# Patient Record
Sex: Male | Born: 1960 | Race: White | Hispanic: No | Marital: Married | Smoking: Never smoker
Health system: Southern US, Community
[De-identification: ages and names within clinical notes are randomized; demographics above are authoritative.]

## PROBLEM LIST (undated history)

## (undated) DIAGNOSIS — E039 Hypothyroidism, unspecified: Secondary | ICD-10-CM

## (undated) DIAGNOSIS — E119 Type 2 diabetes mellitus without complications: Secondary | ICD-10-CM

## (undated) DIAGNOSIS — I10 Essential (primary) hypertension: Secondary | ICD-10-CM

## (undated) DIAGNOSIS — E1140 Type 2 diabetes mellitus with diabetic neuropathy, unspecified: Secondary | ICD-10-CM

## (undated) DIAGNOSIS — E7800 Pure hypercholesterolemia, unspecified: Secondary | ICD-10-CM

## (undated) HISTORY — DX: Type 2 diabetes mellitus without complications: E11.9

## (undated) HISTORY — PX: VASECTOMY: SHX75

## (undated) HISTORY — DX: Hypothyroidism, unspecified: E03.9

## (undated) HISTORY — DX: Pure hypercholesterolemia, unspecified: E78.00

## (undated) HISTORY — DX: Essential (primary) hypertension: I10

## (undated) HISTORY — PX: ABSCESS DRAINAGE: SHX1119

---

## 1998-02-09 HISTORY — PX: TONSILLECTOMY: SUR1361

## 2005-02-09 HISTORY — PX: HERNIA REPAIR: SHX51

## 2010-10-02 ENCOUNTER — Emergency Department (HOSPITAL_COMMUNITY)
Admission: EM | Admit: 2010-10-02 | Discharge: 2010-10-03 | Disposition: A | Discharge status: Home / Self Care | Payer: BC Managed Care – PPO | Attending: Emergency Medicine | Admitting: Emergency Medicine

## 2010-10-02 ENCOUNTER — Emergency Department (HOSPITAL_COMMUNITY): Payer: BC Managed Care – PPO

## 2010-10-02 ENCOUNTER — Encounter: Payer: Self-pay | Admitting: Emergency Medicine

## 2010-10-02 DIAGNOSIS — M79673 Pain in unspecified foot: Secondary | ICD-10-CM

## 2010-10-02 DIAGNOSIS — M79609 Pain in unspecified limb: Secondary | ICD-10-CM | POA: Insufficient documentation

## 2010-10-02 DIAGNOSIS — E119 Type 2 diabetes mellitus without complications: Secondary | ICD-10-CM | POA: Insufficient documentation

## 2010-10-02 DIAGNOSIS — M7989 Other specified soft tissue disorders: Secondary | ICD-10-CM | POA: Insufficient documentation

## 2010-10-02 HISTORY — DX: Type 2 diabetes mellitus with diabetic neuropathy, unspecified: E11.40

## 2010-10-02 MED ORDER — IBUPROFEN 800 MG PO TABS
800.0000 mg | ORAL_TABLET | Freq: Once | ORAL | Status: AC
  Administered 2010-10-02: 800 mg via ORAL
  Filled 2010-10-02: qty 1

## 2010-10-02 NOTE — ED Provider Notes (Signed)
History     CSN: [id number] Arrival date & time: 10/02/2010 11:11 PM  Chief Complaint  Patient presents with  . Leg Swelling   HPI Comments: Seen 2318  Patient is a 50 y.o. male presenting with lower extremity pain. The history is provided by the patient.  Foot Pain This is a new problem. The current episode started 2 days ago. The problem occurs constantly. The problem has not changed since onset.Pertinent negatives include no chest pain, no abdominal pain, no headaches and no shortness of breath. The symptoms are aggravated by walking and twisting. The symptoms are relieved by nothing. He has tried nothing for the symptoms.    Past Medical History  Diagnosis Date  . Diabetes mellitus   . Neuropathy in diabetes     Past Surgical History  Procedure Date  . Tonsillectomy   . Abscess drainage   . Hernia repair     No family history on file.  History  Substance Use Topics  . Smoking status: Never Smoker   . Smokeless tobacco: Not on file  . Alcohol Use: No      Review of Systems  Respiratory: Negative for shortness of breath.   Cardiovascular: Negative for chest pain.  Gastrointestinal: Negative for abdominal pain.  Neurological: Negative for headaches.  All other systems reviewed and are negative.    Physical Exam  BP 149/93  Pulse 89  Temp(Src) 98.2 F (36.8 C) (Oral)  Resp 16  Ht 5' 9.5" (1.765 m)  Wt 217 lb 3.2 oz (98.521 kg)  BMI 31.61 kg/m2  SpO2 99%  Physical Exam  Nursing note and vitals reviewed. Constitutional: He is oriented to person, place, and time. He appears well-developed and well-nourished.  HENT:  Head: Normocephalic and atraumatic.  Eyes: EOM are normal. Pupils are equal, round, and reactive to light.  Neck: Normal range of motion.  Cardiovascular: Normal rate, normal heart sounds and intact distal pulses.   Pulmonary/Chest: Effort normal and breath sounds normal.  Abdominal: Soft. Bowel sounds are normal.  Musculoskeletal:   Right foot with mild soft tissue swelling and lateral nakle swelling. Pulses DP and PT 2+. FROM. No deformity.  Neurological: He is alert and oriented to person, place, and time.  Skin: Skin is warm and dry.    ED Course  Procedures   Dg Foot Complete Right  10/03/2010  *RADIOLOGY REPORT*  Clinical Data: Right foot pain, swelling and erythema.  RIGHT FOOT COMPLETE - 3+ VIEW  Comparison: None.  Findings: There is no evidence of fracture or dislocation.  The joint spaces are preserved.  There is no evidence of talar subluxation; the subtalar joint is unremarkable in appearance.  A posterior calcaneal spur is noted.  No significant soft tissue abnormalities are seen.  IMPRESSION: No evidence of fracture or dislocation.  Original Report Authenticated By: Tonia Ghent, M.D.  MDM Reviewed: nursing note and vitals Interpretation: x-ray  Pt feels improved after observation and/or treatment in ED.Reviewed results with patient and his wife.      Nicoletta Dress. Colon Branch, MD 10/03/10 [phone]

## 2010-10-02 NOTE — ED Notes (Signed)
Patient c/o right leg swelling since Tuesday; states swelling has gotten worse.

## 2010-10-03 MED ORDER — HYDROCODONE-ACETAMINOPHEN 5-325 MG PO TABS
1.0000 | ORAL_TABLET | Freq: Once | ORAL | Status: AC
  Administered 2010-10-03: 1 via ORAL
  Filled 2010-10-03: qty 1

## 2010-10-03 MED ORDER — IBUPROFEN 600 MG PO TABS: 600.0000 mg | ORAL_TABLET | Freq: Four times a day (QID) | ORAL | Status: AC | PRN

## 2010-10-03 MED ORDER — HYDROCODONE-ACETAMINOPHEN 5-325 MG PO TABS: 1.0000 | ORAL_TABLET | ORAL | Status: AC | PRN

## 2013-01-20 ENCOUNTER — Emergency Department (HOSPITAL_COMMUNITY)
Admission: EM | Admit: 2013-01-20 | Discharge: 2013-01-20 | Disposition: A | Discharge status: Home / Self Care | Payer: BC Managed Care – PPO | Attending: Emergency Medicine | Admitting: Emergency Medicine

## 2013-01-20 ENCOUNTER — Encounter (HOSPITAL_COMMUNITY): Payer: Self-pay | Admitting: Emergency Medicine

## 2013-01-20 ENCOUNTER — Emergency Department (HOSPITAL_COMMUNITY): Payer: BC Managed Care – PPO

## 2013-01-20 DIAGNOSIS — Y92410 Unspecified street and highway as the place of occurrence of the external cause: Secondary | ICD-10-CM | POA: Insufficient documentation

## 2013-01-20 DIAGNOSIS — S4980XA Other specified injuries of shoulder and upper arm, unspecified arm, initial encounter: Secondary | ICD-10-CM | POA: Insufficient documentation

## 2013-01-20 DIAGNOSIS — IMO0002 Reserved for concepts with insufficient information to code with codable children: Secondary | ICD-10-CM | POA: Insufficient documentation

## 2013-01-20 DIAGNOSIS — S46909A Unspecified injury of unspecified muscle, fascia and tendon at shoulder and upper arm level, unspecified arm, initial encounter: Secondary | ICD-10-CM | POA: Insufficient documentation

## 2013-01-20 DIAGNOSIS — E1142 Type 2 diabetes mellitus with diabetic polyneuropathy: Secondary | ICD-10-CM | POA: Insufficient documentation

## 2013-01-20 DIAGNOSIS — T148XXA Other injury of unspecified body region, initial encounter: Secondary | ICD-10-CM

## 2013-01-20 DIAGNOSIS — E1149 Type 2 diabetes mellitus with other diabetic neurological complication: Secondary | ICD-10-CM | POA: Insufficient documentation

## 2013-01-20 DIAGNOSIS — Y9389 Activity, other specified: Secondary | ICD-10-CM | POA: Insufficient documentation

## 2013-01-20 DIAGNOSIS — Z79899 Other long term (current) drug therapy: Secondary | ICD-10-CM | POA: Insufficient documentation

## 2013-01-20 MED ORDER — HYDROCODONE-ACETAMINOPHEN 5-325 MG PO TABS: 1.0000 | ORAL_TABLET | ORAL | Status: DC | PRN

## 2013-01-20 MED ORDER — ONDANSETRON HCL 4 MG PO TABS
4.0000 mg | ORAL_TABLET | Freq: Once | ORAL | Status: AC
  Administered 2013-01-20: 4 mg via ORAL
  Filled 2013-01-20: qty 1

## 2013-01-20 MED ORDER — METHOCARBAMOL 500 MG PO TABS
1000.0000 mg | ORAL_TABLET | Freq: Once | ORAL | Status: AC
  Administered 2013-01-20: 1000 mg via ORAL
  Filled 2013-01-20: qty 2

## 2013-01-20 MED ORDER — METHOCARBAMOL 500 MG PO TABS: 500.0000 mg | ORAL_TABLET | Freq: Three times a day (TID) | ORAL | Status: DC

## 2013-01-20 MED ORDER — HYDROCODONE-ACETAMINOPHEN 5-325 MG PO TABS
2.0000 | ORAL_TABLET | Freq: Once | ORAL | Status: AC
  Administered 2013-01-20: 2 via ORAL
  Filled 2013-01-20: qty 2
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
## 2013-01-20 NOTE — ED Provider Notes (Signed)
History/physical exam/procedure(s) were performed by non-physician practitioner and as supervising physician I was immediately available for consultation/collaboration. I have reviewed all notes and am in agreement with care and plan.   Zimal Weisensel S Narcissa Melder, MD 01/20/13 2316 
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
## 2014-02-09 HISTORY — PX: COLECTOMY: SHX59

## 2014-02-09 HISTORY — PX: SHOULDER SURGERY: SHX246

## 2014-11-19 ENCOUNTER — Ambulatory Visit: Payer: Self-pay | Admitting: "Endocrinology

## 2014-11-28 ENCOUNTER — Ambulatory Visit (INDEPENDENT_AMBULATORY_CARE_PROVIDER_SITE_OTHER): Payer: BLUE CROSS/BLUE SHIELD | Admitting: "Endocrinology

## 2014-11-28 ENCOUNTER — Encounter: Payer: Self-pay | Admitting: "Endocrinology

## 2014-11-28 VITALS — BP 134/82 | HR 116 | Ht 69.0 in | Wt 205.0 lb

## 2014-11-28 DIAGNOSIS — E1165 Type 2 diabetes mellitus with hyperglycemia: Secondary | ICD-10-CM | POA: Insufficient documentation

## 2014-11-28 DIAGNOSIS — I10 Essential (primary) hypertension: Secondary | ICD-10-CM | POA: Diagnosis not present

## 2014-11-28 DIAGNOSIS — E118 Type 2 diabetes mellitus with unspecified complications: Secondary | ICD-10-CM

## 2014-11-28 DIAGNOSIS — E785 Hyperlipidemia, unspecified: Secondary | ICD-10-CM | POA: Insufficient documentation

## 2014-11-28 DIAGNOSIS — IMO0002 Reserved for concepts with insufficient information to code with codable children: Secondary | ICD-10-CM | POA: Insufficient documentation

## 2014-11-28 DIAGNOSIS — E039 Hypothyroidism, unspecified: Secondary | ICD-10-CM | POA: Insufficient documentation

## 2014-11-28 DIAGNOSIS — E559 Vitamin D deficiency, unspecified: Secondary | ICD-10-CM | POA: Insufficient documentation

## 2014-11-28 MED ORDER — VITAMIN D (ERGOCALCIFEROL) 1.25 MG (50000 UNIT) PO CAPS: 50000.0000 [IU] | ORAL_CAPSULE | ORAL | Status: DC

## 2014-11-28 MED ORDER — GEMFIBROZIL 600 MG PO TABS: 600.0000 mg | ORAL_TABLET | Freq: Two times a day (BID) | ORAL | Status: DC

## 2014-11-28 NOTE — Patient Instructions (Signed)

## 2014-11-28 NOTE — Progress Notes (Signed)
Subjective:    Patient ID: Joseph Chavez, male    DOB: 06-25-1960,    Past Medical History  Diagnosis Date  . Diabetes mellitus   . Neuropathy in diabetes [facility])    Past Surgical History  Procedure Laterality Date  . Tonsillectomy    . Abscess drainage    . Hernia repair    . Shoulder surgery     Social History   Social History  . Marital Status: Married    Spouse Name: N/A  . Number of Children: N/A  . Years of Education: N/A   Social History Main Topics  . Smoking status: Never Smoker   . Smokeless tobacco: None  . Alcohol Use: No  . Drug Use: No  . Sexual Activity: Not Asked   Other Topics Concern  . None   Social History Narrative   Outpatient Encounter Prescriptions as of 11/28/2014  Medication Sig  . dapagliflozin propanediol (FARXIGA) 10 MG TABS tablet Take 10 mg by mouth daily.  [name] gabapentin (NEURONTIN) 300 MG capsule Take 300-600 mg by mouth 2 (two) times daily. One in the morning and two at bedtime  . gemfibrozil (LOPID) 600 MG tablet Take 1 tablet (600 mg total) by mouth 2 (two) times daily before a meal.  . levothyroxine (SYNTHROID, LEVOTHROID) 150 MCG tablet Take 150 mcg by mouth daily before breakfast.  . Liraglutide (VICTOZA) 18 MG/3ML SOPN Inject 1.8 mg into the skin daily.  . Omega-3 Fatty Acids (FISH OIL) 1200 MG CAPS Take by mouth.  [name] omeprazole (PRILOSEC) 20 MG capsule Take 20 mg by mouth daily.  . propranolol ER (INDERAL LA) 60 MG 24 hr capsule Take 60 mg by mouth every evening.  . [DISCONTINUED] gemfibrozil (LOPID) 600 MG tablet Take 600 mg by mouth 2 (two) times daily before a meal.  . HYDROcodone-acetaminophen (NORCO) 5-325 MG per tablet Take 1 tablet by mouth every 4 (four) hours as needed for moderate pain.  . methocarbamol (ROBAXIN) 500 MG tablet Take 1 tablet (500 mg total) by mouth 3 (three) times daily.  . simvastatin (ZOCOR) 20 MG tablet Take 20 mg by mouth at bedtime.  . Vitamin D, Ergocalciferol, (DRISDOL) 50000 UNITS CAPS capsule  Take 1 capsule (50,000 Units total) by mouth every 7 (seven) days.  . [DISCONTINUED] GLIPIZIDE XL 10 MG 24 hr tablet Take 10 mg by mouth every morning.  . [DISCONTINUED] Levothyroxine Sodium 25 MCG CAPS Take 25 mcg by mouth daily before breakfast.   No facility-administered encounter medications on file as of 11/28/2014.   ALLERGIES: No Known Allergies VACCINATION STATUS:  There is no immunization history on file for this patient.  HPI  Joseph Chavez is a 54- yr- old patient with medical history as above. Patient is here to follow-up uncontrolled type 2 DM.  Patient was diagnosed with type 2 DM at age 54 years. Since last visit, he underwent colonoscopy which showed multiple polyps one of them precancerous underwent partial colectomy. Patient's recent A1c has increased to 9.3%  from 8.1%. This is largely due to multiple interruptions in his therapy during the process.  Patient reports moderate degree of polydipsia, polyuria, and nocturia.  Patient was supposed to be on metformin 1 g twice a day, Victoza 1.8 mg daily,  Farxiga  qday.  The patient did not bring any meter nor log book, and admits to not monitoring BG regularly. Also has HTN, HPL on treatment and has been overweight to obese most of his adult life. Patient denies  history of CAD, CVA, CKD, Neuropathy, and Retinopathy. Pt gives a family hx of type 2 DM in father, siblings. Patient is a non- smoker, does participate in a regular exercise program. Patient is willing to engage in intensive monitoring and therapy along with change in life style.   Review of Systems  Constitutional: Negative for fatigue and unexpected weight change.  HENT: Negative for dental problem, mouth sores and trouble swallowing.   Eyes: Negative for visual disturbance.  Respiratory: Negative for cough, choking, chest tightness, shortness of breath and wheezing.   Cardiovascular: Negative for chest pain, palpitations and leg swelling.  Gastrointestinal:  Negative for nausea, vomiting, abdominal pain, diarrhea, constipation and abdominal distention.  Endocrine: Positive for polydipsia and polyuria. Negative for polyphagia.  Genitourinary: Negative for dysuria, urgency, hematuria and flank pain.  Musculoskeletal: Negative for myalgias, back pain, gait problem and neck pain.  Skin: Negative for pallor, rash and wound.  Neurological: Negative for seizures, syncope, weakness, numbness and headaches.  Psychiatric/Behavioral: Negative.  Negative for confusion and dysphoric mood.    Objective:    BP 134/82 mmHg  Pulse 116  Ht 5\' 9"  (1.753 m)  Wt 205 lb (92.987 kg)  BMI 30.26 kg/m2  SpO2 99%  Wt Readings from Last 3 Encounters:  11/28/14 205 lb (92.987 kg)  01/20/13 224 lb (101.606 kg)  10/02/10 217 lb 3.2 oz (98.521 kg)    Physical Exam  Constitutional: He is oriented to person, place, and time. He appears well-developed and well-nourished. He is cooperative. No distress.  HENT:  Head: Normocephalic and atraumatic.  Eyes: EOM are normal.  Neck: Normal range of motion. Neck supple. No tracheal deviation present. No thyromegaly present.  Cardiovascular: Normal rate, S1 normal, S2 normal and normal heart sounds.  Exam reveals no gallop.   No murmur heard. Pulses:      Dorsalis pedis pulses are 1+ on the right side, and 1+ on the left side.       Posterior tibial pulses are 1+ on the right side, and 1+ on the left side.  Pulmonary/Chest: Breath sounds normal. No respiratory distress. He has no wheezes.  Abdominal: Soft. Bowel sounds are normal. He exhibits no distension. There is no tenderness. There is no guarding and no CVA tenderness.  He has healing laparoscopic surgical wounds on his abdomen.  Musculoskeletal: He exhibits no edema.       Right shoulder: He exhibits no swelling and no deformity.  Neurological: He is alert and oriented to person, place, and time. He has normal strength and normal reflexes. No cranial nerve deficit or  sensory deficit. Gait normal.  Skin: Skin is warm and dry. No rash noted. No cyanosis. Nails show no clubbing.  Psychiatric: He has a normal mood and affect. His speech is normal and behavior is normal. Judgment and thought content normal. Cognition and memory are normal.         Assessment & Plan:   1. Uncontrolled type 2 diabetes mellitus with complication, without long-term current use of insulin (HCC)  Patient came with elevated A1c of 9.3% from 8.1%.   Recent labs reviewed. - Patient remains at a high risk for more acute and chronic complications of diabetes which include CAD, CVA, CKD, retinopathy, and neuropathy. These are all discussed in detail with the patient.  - I have re-counseled the patient on diet management and weight loss  by adopting a carbohydrate restricted / protein rich  Diet. - Patient is advised to stick to a routine mealtimes  to eat 3 meals  a day and avoid unnecessary snacks ( to snack only to correct hypoglycemia).  - Suggestion is made for patient to avoid simple carbohydrates   from their diet including Cakes , Desserts, Ice Cream,  Soda (  diet and regular) , Sweet Tea , Candies,  Chips, Cookies, Artificial Sweeteners,   and "Sugar-free" Products .  This will help patient to have stable blood glucose profile and potentially avoid unintended  Weight gain.  - The patient  has been  scheduled with Norm Salt, RDN, CDE for individualized DM education. - I have approached patient with the following individualized plan to manage diabetes and patient agrees.  -He would need at least basal insulin to treat his diabetes however he is hesitant to engage in insulin therapy.  -He agrees to continue and resume Victoza 1.8mg  sq daily, continue [place]  po qday.   - Patient specific target  for A1c; LDL, HDL, Triglycerides, and  Waist Circumference were discussed in detail.  2) BP/HTN: Controlled. Continue current medications . 3) Lipids/HPL:  continue  statins. 4)  Weight/Diet:  exercise, and carbohydrates information provided. 5. Primary hypothyroidism Continue levothyroxine 150 g by mouth every morning. Will  obtain before next visit - TSH - T4, free  6. Vitamin D deficiency I will initiate vitamin D supplement for him.  7) Chronic Care/Health Maintenance:  -Patient is on  Statin medications and encouraged to continue to follow up with Ophthalmology, Podiatrist at least yearly or according to recommendations, and advised to  stay away from smoking. I have recommended yearly flu vaccine and pneumonia vaccination at least every 5 years; moderate intensity exercise for up to 150 minutes weekly; and  sleep for at least 7 hours a day.  I advised patient to maintain close follow up with their PCP for primary care needs.  Patient is asked to bring meter and  blood glucose logs during their next visit.   Follow up plan: Return in about 3 months (around 02/28/2015) for diabetes, high blood pressure, high cholesterol, follow up with pre-visit labs, meter, and logs.  Marquis Lunch, MD Phone: [phone]  Fax: [phone]   11/28/2014, 8:37 PM

## 2015-02-25 ENCOUNTER — Encounter: Payer: Self-pay | Admitting: *Deleted

## 2015-02-25 ENCOUNTER — Ambulatory Visit (INDEPENDENT_AMBULATORY_CARE_PROVIDER_SITE_OTHER): Payer: BLUE CROSS/BLUE SHIELD | Admitting: Neurology

## 2015-02-25 ENCOUNTER — Encounter: Payer: Self-pay | Admitting: Neurology

## 2015-02-25 VITALS — BP 129/75 | HR 82 | Ht 69.0 in | Wt 202.4 lb

## 2015-02-25 DIAGNOSIS — M6289 Other specified disorders of muscle: Secondary | ICD-10-CM | POA: Diagnosis not present

## 2015-02-25 DIAGNOSIS — M542 Cervicalgia: Secondary | ICD-10-CM

## 2015-02-25 DIAGNOSIS — R278 Other lack of coordination: Secondary | ICD-10-CM | POA: Insufficient documentation

## 2015-02-25 DIAGNOSIS — R296 Repeated falls: Secondary | ICD-10-CM | POA: Insufficient documentation

## 2015-02-25 DIAGNOSIS — E0842 Diabetes mellitus due to underlying condition with diabetic polyneuropathy: Secondary | ICD-10-CM

## 2015-02-25 DIAGNOSIS — R531 Weakness: Secondary | ICD-10-CM | POA: Insufficient documentation

## 2015-02-25 DIAGNOSIS — R1310 Dysphagia, unspecified: Secondary | ICD-10-CM | POA: Diagnosis not present

## 2015-02-25 DIAGNOSIS — R269 Unspecified abnormalities of gait and mobility: Secondary | ICD-10-CM

## 2015-02-25 DIAGNOSIS — G629 Polyneuropathy, unspecified: Secondary | ICD-10-CM

## 2015-02-25 DIAGNOSIS — R270 Ataxia, unspecified: Secondary | ICD-10-CM | POA: Diagnosis not present

## 2015-02-25 NOTE — Progress Notes (Signed)
[id number]    Provider:  Dr Lucia Gaskins Referring Provider: Lianne Moris PA-C Primary Care Physician:  Selinda Flavin, MD  CC:  Poor balance.   HPI:  Joseph Chavez is a 55 y.o. male here as a referral from Dr. Dimas Aguas for multiple issues. PMHx of uncontrolled DM, hypothyroidism, HLD, His wife is here with him. He has been falling. He has poor balance. Memory has been funny. Symptoms started over the summer. 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 He just went down, he tripped over his feet. He has uncontrolled diabetes. He stopped taking his medication and his last HgbA1c is 11.2. Takes neurontin for neuropathy. 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 The pain is severe, tingling, burning, numbness. He has cramping in the feet. The symptoms are continuous and progressing and slowly worsening over the years. He is hypothyroid and his TSH is elevated. No significant headaches, diplopia, aphasia, focal weakness, facial droop. He has had uncontrolled glucose for 5 or more years. He says he has speaking problems, his speech "catches". He has fatigue. He reports getting choked on swallowing sometimes with saliva or liquids. He has some occ.neck pain and LBP but no radicular symptoms.  Father with diabetic neuropathy. He reports tremors, he shakes with his hands a lot of the time per wife. Propranolol may hellp, he is unsure. His handwriting is poor. No vision changes. Denies smoking, denies current alcohol intake. In the past more social drinking, < 1 a day. He had part of his colon removed due to polyps. He is slow walker, has to look at his feet to see what is under his feet.   Reviewed notes, labs and imaging from outside physicians, which showed:  HgbA1c 11.2 BMP with glucose 462 Creatinine 0.79 01/2015 LFTs wnl TSH 7.820 LDL 88 B12 346  MRI of the brain: personally reviewed images: essentially normal MRI of the  brain, a few foci of t2 hyperintensity may be non-specific chronic microvascular changes   Review of Systems: Patient complains of symptoms per HPI as well as the following symptoms: fatigue, blurred visiuon, increased thirst, hearing loss, ringing in ears, joint pain, aching muscles, memory loss, cinfusion, numbness, weakness, dififculty swallowing, dizziness, tremor, restless legs, decreased energy . Pertinent negatives per HPI. All others negative.   Social History   Social History  . Marital Status: Married    Spouse Name: Joseph Chavez  . Number of Children: 3  . Years of Education: 12+   Occupational History  . Not on file.   Social History Main Topics  . Smoking status: Never Smoker   . Smokeless tobacco: Not on file  . Alcohol Use: No  . Drug Use: No  . Sexual Activity: Not on file   Other Topics Concern  . Not on file   Social History Narrative   Lives with wife and 3 kids   Caffeine use:  20 oz drinks per day    Family History  Problem Relation Age of Onset  . Hypertension Mother   . Hypertension Father   . Diabetes Father   . CAD Father   . Neuropathy Father   . Melanoma Paternal Grandmother     Past Medical History  Diagnosis Date  . Diabetes mellitus   . Neuropathy in diabetes [facility])     Past Surgical History  Procedure Laterality Date  . Tonsillectomy  2000  . Abscess drainage    .  Hernia repair  2007    Umbilical  . Shoulder surgery    . Vasectomy    . Colectomy      Current Outpatient Prescriptions  Medication Sig Dispense Refill  . dapagliflozin propanediol (FARXIGA) 10 MG TABS tablet Take 10 mg by mouth daily.    [name]. gabapentin (NEURONTIN) 300 MG capsule Take 300-600 mg by mouth 2 (two) times daily. One in the morning and two at bedtime    . gemfibrozil (LOPID) 600 MG tablet Take 1 tablet (600 mg total) by mouth 2 (two) times daily before a meal. 60 tablet 3  . levothyroxine (SYNTHROID, LEVOTHROID) 150 MCG tablet Take 150 mcg by mouth daily  before breakfast.    . Liraglutide (VICTOZA) 18 MG/3ML SOPN Inject 1.8 mg into the skin daily.    . Omega-3 Fatty Acids (FISH OIL) 1200 MG CAPS Take by mouth.    [name]. omeprazole (PRILOSEC) 20 MG capsule Take 20 mg by mouth daily.    . propranolol ER (INDERAL LA) 60 MG 24 hr capsule Take 60 mg by mouth every evening.    . simvastatin (ZOCOR) 20 MG tablet Take 20 mg by mouth at bedtime.    . Vitamin D, Ergocalciferol, (DRISDOL) 50000 UNITS CAPS capsule Take 1 capsule (50,000 Units total) by mouth every 7 (seven) days. 12 capsule 0   No current facility-administered medications for this visit.    Allergies as of 02/25/2015  . (No Known Allergies)    Vitals: BP 129/75 mmHg  Pulse 82  Ht 5\' 9"  (1.753 m)  Wt 202 lb 6.4 oz (91.808 kg)  BMI 29.88 kg/m2 Last Weight:  Wt Readings from Last 1 Encounters:  02/25/15 202 lb 6.4 oz (91.808 kg)   Last Height:   Ht Readings from Last 1 Encounters:  02/25/15 5\' 9"  (1.753 m)    Physical exam: Exam: Gen: NAD, conversant, well nourised, obese, well groomed                     CV: RRR, no MRG. No Carotid Bruits. No peripheral edema, warm, nontender Eyes: Conjunctivae clear without exudates or hemorrhage  Neuro: Detailed Neurologic Exam  Speech:    Speech is normal; fluent and spontaneous with normal comprehension.  Cognition:    The patient is oriented to person, place, and time;     recent and remote memory intact;     language fluent;     normal attention, concentration,     fund of knowledge Cranial Nerves:    The pupils are equal, round, and reactive to light. The fundi are flat Visual fields are full to finger confrontation. Extraocular movements are intact. Trigeminal sensation is intact and the muscles of mastication are normal. The face is symmetric. The palate elevates in the midline. Hearing intact. Voice is normal. Shoulder shrug is normal. The tongue has normal motion without fasciculations.   Coordination:    dysmetria HTS.  Slow on FTN.  Gait:    Wide-based, ataxic  Motor Observation: High frequency, low amplitude postural UE tremor.    No asymmetry, no atrophy.  Tone:    Normal muscle tone.    Posture:    Posture is normal. normal erect    Strength: Bilat DF 4+/5. Left Biceps 4+/5, Left Hip flexion 4/5. Left Biceps Femoris 4+/5.     Strength is V/V in the upper and lower limbs.      Sensation: Decr pp and temp tio the knees. Absent vibration to the medial malleoli  where <1 second vibration. Absent proprioception in the feet.     Reflex Exam:  DTR's: Absent AJs. Hyporeflexic otherwise.    Toes:    The toes are equivocal bilaterally.   Clonus:    Clonus is absent.      Assessment/Plan:  55 year old male with uncontrolled diabetes (last hgba1c 11.2), sensory ataxia, left-sided weakness, distal dorsiflexion bilat weakness, dysmetria, significant sensory impairment in the legs in pp, temp, vibration and proprioception. MRI of the brain was unremarkable. The sensory ataxia and distal weakness could be secondary to diabetic peripheral neuropathy but that doesn't explain the other symptoms. Will perform an MRI of the cervical spine, Neuropathy serum screen, EMG/NCS of all 4 limbs.  Needs physical therapy, is a fall risk, may need walking aid.   CC: Dr. Dimas Aguas and Bridgette Habermann, MD  [facility] [address] [place], [place] [address]  Phone [phone] Fax [phone]

## 2015-02-25 NOTE — Patient Instructions (Signed)
Remember to drink plenty of fluid, eat healthy meals and do not skip any meals. Try to eat protein with a every meal and eat a healthy snack such as fruit or nuts in between meals. Try to keep a regular sleep-wake schedule and try to exercise daily, particularly in the form of walking, 20-30 minutes a day, if you can.   As far as your medications are concerned, I would like to suggest: Alpha Lipoic Acid 400-600mg  daily can help with diabetic neuropathy. Continue neurontin.  As far as diagnostic testing: EMG/NCS, Labs, physical therapy for gait and safety  I would like to see you back for emg/ncs, sooner if we need to. Please call us with any interim questions, concerns, problems, updates or refill requests.   Our phone number is [phone]. We also have an after hours call service for urgent matters and there is a physician on-call for urgent questions. For any emergencies you know to call 911 or go to the nearest emergency room

## 2015-02-27 ENCOUNTER — Telehealth: Payer: Self-pay | Admitting: *Deleted

## 2015-02-27 LAB — MULTIPLE MYELOMA PANEL, SERUM
ALBUMIN SERPL ELPH-MCNC: 4 g/dL (ref 2.9–4.4)
ALPHA 1: 0.2 g/dL (ref 0.0–0.4)
Albumin/Glob SerPl: 1 (ref 0.7–1.7)
Alpha2 Glob SerPl Elph-Mcnc: 1.2 g/dL — ABNORMAL HIGH (ref 0.4–1.0)
B-GLOBULIN SERPL ELPH-MCNC: 1.4 g/dL — AB (ref 0.7–1.3)
GAMMA GLOB SERPL ELPH-MCNC: 1.3 g/dL (ref 0.4–1.8)
GLOBULIN, TOTAL: 4.1 g/dL — AB (ref 2.2–3.9)
IgA/Immunoglobulin A, Serum: 284 mg/dL (ref 90–386)
IgG (Immunoglobin G), Serum: 1147 mg/dL (ref 700–1600)
IgM (Immunoglobulin M), Srm: 37 mg/dL (ref 20–172)
Total Protein: 8.1 g/dL (ref 6.0–8.5)

## 2015-02-27 LAB — RHEUMATOID FACTOR: Rhuematoid fact SerPl-aCnc: <10 IU/mL (ref 0.0–13.9)

## 2015-02-27 LAB — B. BURGDORFI ANTIBODIES: Lyme IgG/IgM Ab: <0.91 {ISR} (ref 0.00–0.90)

## 2015-02-27 LAB — ANA W/REFLEX: ANA: NEGATIVE

## 2015-02-27 LAB — HEAVY METALS, BLOOD
Arsenic: 4 ug/L (ref 2–23)
LEAD, BLOOD: NOT DETECTED ug/dL (ref 0–19)
MERCURY: NOT DETECTED ug/L (ref 0.0–14.9)

## 2015-02-27 LAB — RPR: RPR Ser Ql: NONREACTIVE

## 2015-02-27 LAB — VITAMIN B6: VITAMIN B6: 15 ug/L (ref 5.3–46.7)

## 2015-02-27 LAB — HEPATITIS C ANTIBODY: Hep C Virus Ab: <0.1 s/co ratio (ref 0.0–0.9)

## 2015-02-27 LAB — VITAMIN B1: Thiamine: 158.3 nmol/L (ref 66.5–200.0)

## 2015-02-27 NOTE — Telephone Encounter (Signed)
-----   Message from Anson Fret, MD sent at 02/27/2015  8:27 AM EST ----- Labs all normal thanks

## 2015-02-27 NOTE — Telephone Encounter (Signed)
LVM labs normal per Dr Lucia Gaskins. Gave [facility] phone number if he has further questions.

## 2015-02-28 ENCOUNTER — Ambulatory Visit: Payer: BLUE CROSS/BLUE SHIELD | Admitting: "Endocrinology

## 2015-03-02 ENCOUNTER — Encounter: Payer: Self-pay | Admitting: Neurology

## 2015-03-02 DIAGNOSIS — E1140 Type 2 diabetes mellitus with diabetic neuropathy, unspecified: Secondary | ICD-10-CM | POA: Insufficient documentation

## 2015-03-11 ENCOUNTER — Ambulatory Visit: Payer: BLUE CROSS/BLUE SHIELD | Attending: Neurology | Admitting: Rehabilitation

## 2015-03-13 ENCOUNTER — Ambulatory Visit (INDEPENDENT_AMBULATORY_CARE_PROVIDER_SITE_OTHER): Payer: BLUE CROSS/BLUE SHIELD | Admitting: "Endocrinology

## 2015-03-13 ENCOUNTER — Encounter: Payer: Self-pay | Admitting: "Endocrinology

## 2015-03-13 VITALS — BP 125/80 | HR 80 | Ht 69.0 in | Wt 205.0 lb

## 2015-03-13 DIAGNOSIS — E785 Hyperlipidemia, unspecified: Secondary | ICD-10-CM | POA: Diagnosis not present

## 2015-03-13 DIAGNOSIS — E039 Hypothyroidism, unspecified: Secondary | ICD-10-CM

## 2015-03-13 DIAGNOSIS — E118 Type 2 diabetes mellitus with unspecified complications: Secondary | ICD-10-CM

## 2015-03-13 DIAGNOSIS — IMO0002 Reserved for concepts with insufficient information to code with codable children: Secondary | ICD-10-CM

## 2015-03-13 DIAGNOSIS — E1165 Type 2 diabetes mellitus with hyperglycemia: Secondary | ICD-10-CM

## 2015-03-13 DIAGNOSIS — I10 Essential (primary) hypertension: Secondary | ICD-10-CM

## 2015-03-13 MED ORDER — INSULIN GLARGINE 300 UNIT/ML ~~LOC~~ SOPN
20.0000 [IU] | PEN_INJECTOR | Freq: Every day | SUBCUTANEOUS | Status: DC
Start: 2015-03-13 — End: 2018-01-24

## 2015-03-13 MED ORDER — ACCU-CHEK AVIVA DEVI: Status: AC

## 2015-03-13 MED ORDER — LEVOTHYROXINE SODIUM 175 MCG PO TABS: 175.0000 ug | ORAL_TABLET | Freq: Every day | ORAL | Status: DC

## 2015-03-13 MED ORDER — GLUCOSE BLOOD VI STRP: ORAL_STRIP | Status: AC

## 2015-03-13 NOTE — Patient Instructions (Signed)

## 2015-03-13 NOTE — Progress Notes (Signed)
Subjective:    Patient ID: Joseph Chavez, male    DOB: [birth year],    Past Medical History  Diagnosis Date  . Diabetes mellitus   . Neuropathy in diabetes [facility])    Past Surgical History  Procedure Laterality Date  . Tonsillectomy  2000  . Abscess drainage    . Hernia repair  2007    Umbilical  . Shoulder surgery    . Vasectomy    . Colectomy     Social History   Social History  . Marital Status: Married    Spouse Name: [name]  . Number of Children: 3  . Years of Education: 12+   Social History Main Topics  . Smoking status: Never Smoker   . Smokeless tobacco: None  . Alcohol Use: No  . Drug Use: No  . Sexual Activity: Not Asked   Other Topics Concern  . None   Social History Narrative   Lives with wife and 3 kids   Caffeine use:  20 oz drinks per day   Outpatient Encounter Prescriptions as of 03/13/2015  Medication Sig  . cephALEXin (KEFLEX) 500 MG capsule Take 500 mg by mouth 2 (two) times daily.  . Blood Glucose Monitoring Suppl (ACCU-CHEK AVIVA) device Use as instructed  . dapagliflozin propanediol (FARXIGA) 10 MG TABS tablet Take 10 mg by mouth daily.  [name] gabapentin (NEURONTIN) 300 MG capsule Take 300-600 mg by mouth 2 (two) times daily. One in the morning and two at bedtime  . gemfibrozil (LOPID) 600 MG tablet Take 1 tablet (600 mg total) by mouth 2 (two) times daily before a meal.  . glucose blood (ACCU-CHEK AVIVA) test strip Use to test glucose 4 times a day  . Insulin Glargine (TOUJEO SOLOSTAR) 300 UNIT/ML SOPN Inject 20 Units into the skin at bedtime.  [name] levothyroxine (SYNTHROID, LEVOTHROID) 150 MCG tablet Take 150 mcg by mouth daily before breakfast.  . Liraglutide (VICTOZA) 18 MG/3ML SOPN Inject 1.8 mg into the skin daily.  . Omega-3 Fatty Acids (FISH OIL) 1200 MG CAPS Take by mouth.  [name] omeprazole (PRILOSEC) 20 MG capsule Take 20 mg by mouth daily.  . propranolol ER (INDERAL LA) 60 MG 24 hr capsule Take 60 mg by mouth every evening.  . simvastatin  (ZOCOR) 20 MG tablet Take 20 mg by mouth at bedtime.  . Vitamin D, Ergocalciferol, (DRISDOL) 50000 UNITS CAPS capsule Take 1 capsule (50,000 Units total) by mouth every 7 (seven) days.   No facility-administered encounter medications on file as of 03/13/2015.   ALLERGIES: No Known Allergies VACCINATION STATUS:  There is no immunization history on file for this patient.  HPI  Mr. Reale is a -55- yr- old patient with medical history as above. Patient is here to follow-up uncontrolled type 2 DM.  Patient was diagnosed with type 2 DM at age 57 years. Since last visit, he underwent colonoscopy which showed multiple polyps one of them precancerous underwent partial colectomy. Patient's recent A1c has increased to  11.2 % from 9.3% .  Patient reports moderate degree of polydipsia, polyuria, and nocturia.  Patient  Has been on  Victoza 1.8 mg daily,  Farxiga  qday.  The patient did not bring any meter nor log book, and admits to not monitoring BG regularly. Also has HTN, HPL on treatment and has been overweight to obese most of his adult life. Patient denies history of CAD, CVA, CKD, Neuropathy, and Retinopathy. Pt gives a family hx of type 2 DM in  father, siblings. Patient is a non- smoker, does participate in a regular exercise program. Patient is willing to engage in intensive monitoring and therapy along with change in life style.   Review of Systems  Constitutional: Negative for fatigue and unexpected weight change.  HENT: Negative for dental problem, mouth sores and trouble swallowing.   Eyes: Negative for visual disturbance.  Respiratory: Negative for cough, choking, chest tightness, shortness of breath and wheezing.   Cardiovascular: Negative for chest pain, palpitations and leg swelling.  Gastrointestinal: Negative for nausea, vomiting, abdominal pain, diarrhea, constipation and abdominal distention.  Endocrine: Positive for polydipsia and polyuria. Negative for polyphagia.   Genitourinary: Negative for dysuria, urgency, hematuria and flank pain.  Musculoskeletal: Negative for myalgias, back pain, gait problem and neck pain.  Skin: Negative for pallor, rash and wound.  Neurological: Negative for seizures, syncope, weakness, numbness and headaches.  Psychiatric/Behavioral: Negative.  Negative for confusion and dysphoric mood.    Objective:    BP 125/80 mmHg  Pulse 80  Ht  (1.753 m)  Wt 205 lb (92.987 kg)  BMI 30.26 kg/m2  SpO2 96%  Wt Readings from Last 3 Encounters:  03/13/15 205 lb (92.987 kg)  02/25/15 202 lb 6.4 oz (91.808 kg)  11/28/14 205 lb (92.987 kg)    Physical Exam  Constitutional: He is oriented to person, place, and time. He appears well-developed and well-nourished. He is cooperative. No distress.  HENT:  Head: Normocephalic and atraumatic.  Eyes: EOM are normal.  Neck: Normal range of motion. Neck supple. No tracheal deviation present. No thyromegaly present.  Cardiovascular: Normal rate, S1 normal, S2 normal and normal heart sounds.  Exam reveals no gallop.   No murmur heard. Pulses:      Dorsalis pedis pulses are 1+ on the right side, and 1+ on the left side.       Posterior tibial pulses are 1+ on the right side, and 1+ on the left side.  Pulmonary/Chest: Breath sounds normal. No respiratory distress. He has no wheezes.  Abdominal: Soft. Bowel sounds are normal. He exhibits no distension. There is no tenderness. There is no guarding and no CVA tenderness.  He has healing laparoscopic surgical wounds on his abdomen.  Musculoskeletal: He exhibits no edema.       Right shoulder: He exhibits no swelling and no deformity.  Neurological: He is alert and oriented to person, place, and time. He has normal strength and normal reflexes. No cranial nerve deficit or sensory deficit. Gait normal.  Skin: Skin is warm and dry. No rash noted. No cyanosis. Nails show no clubbing.  Psychiatric: He has a normal mood and affect. His speech is  normal and behavior is normal. Judgment and thought content normal. Cognition and memory are normal.         Assessment & Plan:   1. Uncontrolled type 2 diabetes mellitus with complication, without long-term current use of insulin (HCC)  Patient came with elevated A1c of 9.3% from 8.1%.   Recent labs reviewed. - Patient remains at a high risk for more acute and chronic complications of diabetes which include CAD, CVA, CKD, retinopathy, and neuropathy. These are all discussed in detail with the patient.  - I have re-counseled the patient on diet management and weight loss  by adopting a carbohydrate restricted / protein rich  Diet. - Patient is advised to stick to a routine mealtimes to eat 3 meals  a day and avoid unnecessary snacks ( to snack only to correct hypoglycemia).  -  Suggestion is made for patient to avoid simple carbohydrates   from their diet including Cakes , Desserts, Ice Cream,  Soda (  diet and regular) , Sweet Tea , Candies,  Chips, Cookies, Artificial Sweeteners,   and "Sugar-free" Products .  This will help patient to have stable blood glucose profile and potentially avoid unintended  Weight gain.  - The patient  has been  scheduled with Norm Salt, RDN, CDE for individualized DM education. - I have approached patient with the following individualized plan to manage diabetes and patient agrees.  -He will need at least basal insulin to treat his diabetes, and he agrees. -I will initiate Toujeo 20 units qhs, associated with monitoring of BG ac and HS. -He will be assessed if he need prandial insulin as well.  -He agrees to continue Victoza 1.8mg  sq daily, continue Farxiga 10mg  po qday.  - Patient specific target  for A1c; LDL, HDL, Triglycerides, and  Waist Circumference were discussed in detail.  2) BP/HTN: Controlled. Continue current medications . 3) Lipids/HPL:  continue statins. 4)  Weight/Diet:  exercise, and carbohydrates information provided. 5. Primary  hypothyroidism - I will increase levothyroxine  To 175 g by mouth every morning. Will  obtain before next visit.   - We discussed about correct intake of levothyroxine, at fasting, with water, separated by at least 30 minutes from breakfast, and separated by more than 4 hours from calcium, iron, multivitamins, acid reflux medications (PPIs). -Patient is made aware of the fact that thyroid hormone replacement is needed for life, dose to be adjusted by periodic monitoring of thyroid function tests.  6. Vitamin D deficiency -he is s/p therapy with vitamin D.  7) Chronic Care/Health Maintenance:  -Patient is on  Statin medications and encouraged to continue to follow up with Ophthalmology, Podiatrist at least yearly or according to recommendations, and advised to  stay away from smoking. I have recommended yearly flu vaccine and pneumonia vaccination at least every 5 years; moderate intensity exercise for up to 150 minutes weekly; and  sleep for at least 7 hours a day.  I advised patient to maintain close follow up with their PCP for primary care needs.  Patient is asked to bring meter and  blood glucose logs during their next visit.   Follow up plan: Return in about 2 weeks (around 03/27/2015) for diabetes, high blood pressure, high cholesterol, underactive thyroid, follow up with meter and logs- no labs.  Marquis Lunch, MD Phone: [phone]  Fax: [phone]   03/13/2015, 4:08 PM

## 2015-03-19 ENCOUNTER — Other Ambulatory Visit: Payer: Self-pay

## 2015-03-19 MED ORDER — PEN NEEDLES 31G X 6 MM MISC: 1.0000 | Freq: Every day | Status: AC

## 2015-03-21 ENCOUNTER — Telehealth: Payer: Self-pay | Admitting: *Deleted

## 2015-03-21 NOTE — Telephone Encounter (Signed)
Release faxed to [facility] requesting records and Imaging.

## 2015-03-21 NOTE — Telephone Encounter (Signed)
Patient office notes and imaging report on [place] desk.

## 2015-03-29 ENCOUNTER — Ambulatory Visit: Payer: BLUE CROSS/BLUE SHIELD | Admitting: "Endocrinology

## 2015-04-08 ENCOUNTER — Encounter: Payer: BLUE CROSS/BLUE SHIELD | Admitting: Neurology

## 2015-04-09 ENCOUNTER — Encounter: Payer: Self-pay | Admitting: Neurology

## 2015-04-22 ENCOUNTER — Other Ambulatory Visit: Payer: Self-pay

## 2015-04-22 MED ORDER — GEMFIBROZIL 600 MG PO TABS: 600.0000 mg | ORAL_TABLET | Freq: Two times a day (BID) | ORAL | Status: AC

## 2015-08-05 ENCOUNTER — Other Ambulatory Visit: Payer: Self-pay

## 2015-08-05 MED ORDER — LIRAGLUTIDE 18 MG/3ML ~~LOC~~ SOPN: 1.8000 mg | PEN_INJECTOR | Freq: Every day | SUBCUTANEOUS | Status: DC

## 2016-04-23 ENCOUNTER — Other Ambulatory Visit: Payer: Self-pay | Admitting: "Endocrinology

## 2017-12-09 ENCOUNTER — Other Ambulatory Visit: Payer: Self-pay | Admitting: "Endocrinology

## 2017-12-13 ENCOUNTER — Encounter (HOSPITAL_COMMUNITY): Payer: Self-pay | Admitting: Internal Medicine

## 2017-12-13 ENCOUNTER — Emergency Department (HOSPITAL_COMMUNITY)
Admission: EM | Admit: 2017-12-13 | Discharge: 2017-12-13 | Disposition: A | Discharge status: Home / Self Care | Payer: BC Managed Care – PPO | Attending: Emergency Medicine | Admitting: Emergency Medicine

## 2017-12-13 ENCOUNTER — Emergency Department (HOSPITAL_COMMUNITY): Payer: BC Managed Care – PPO

## 2017-12-13 DIAGNOSIS — Z79899 Other long term (current) drug therapy: Secondary | ICD-10-CM | POA: Insufficient documentation

## 2017-12-13 DIAGNOSIS — R42 Dizziness and giddiness: Secondary | ICD-10-CM | POA: Diagnosis present

## 2017-12-13 DIAGNOSIS — E039 Hypothyroidism, unspecified: Secondary | ICD-10-CM | POA: Diagnosis not present

## 2017-12-13 DIAGNOSIS — I10 Essential (primary) hypertension: Secondary | ICD-10-CM | POA: Diagnosis not present

## 2017-12-13 DIAGNOSIS — E119 Type 2 diabetes mellitus without complications: Secondary | ICD-10-CM | POA: Insufficient documentation

## 2017-12-13 DIAGNOSIS — Z794 Long term (current) use of insulin: Secondary | ICD-10-CM | POA: Insufficient documentation

## 2017-12-13 LAB — URINALYSIS, ROUTINE W REFLEX MICROSCOPIC
BILIRUBIN URINE: NEGATIVE
Bacteria, UA: NONE SEEN
HGB URINE DIPSTICK: NEGATIVE
Ketones, ur: 5 mg/dL — AB
Leukocytes, UA: NEGATIVE
Nitrite: NEGATIVE
PH: 5 (ref 5.0–8.0)
Protein, ur: NEGATIVE mg/dL
SPECIFIC GRAVITY, URINE: 1.031 — AB (ref 1.005–1.030)

## 2017-12-13 LAB — CBC
HCT: 49.7 % (ref 39.0–52.0)
Hemoglobin: 16.3 g/dL (ref 13.0–17.0)
MCH: 28.8 pg (ref 26.0–34.0)
MCHC: 32.8 g/dL (ref 30.0–36.0)
MCV: 87.8 fL (ref 80.0–100.0)
PLATELETS: 162 10*3/uL (ref 150–400)
RBC: 5.66 MIL/uL (ref 4.22–5.81)
RDW: 12.4 % (ref 11.5–15.5)
WBC: 6.5 10*3/uL (ref 4.0–10.5)
nRBC: 0 % (ref 0.0–0.2)

## 2017-12-13 LAB — CBG MONITORING, ED: Glucose-Capillary: 293 mg/dL — ABNORMAL HIGH (ref 70–99)

## 2017-12-13 MED ORDER — ONDANSETRON 4 MG PO TBDP: 4.0000 mg | ORAL_TABLET | Freq: Three times a day (TID) | ORAL | 0 refills | Status: DC | PRN

## 2017-12-13 MED ORDER — MECLIZINE HCL 25 MG PO TABS
25.0000 mg | ORAL_TABLET | Freq: Once | ORAL | Status: AC
  Administered 2017-12-13: 25 mg via ORAL
  Filled 2017-12-13: qty 1

## 2017-12-13 MED ORDER — MECLIZINE HCL 12.5 MG PO TABS: 25.0000 mg | ORAL_TABLET | Freq: Three times a day (TID) | ORAL | 0 refills | Status: DC | PRN

## 2017-12-13 MED ORDER — ONDANSETRON HCL 4 MG/2ML IJ SOLN
4.0000 mg | Freq: Once | INTRAMUSCULAR | Status: AC
  Administered 2017-12-13: 4 mg via INTRAVENOUS
  Filled 2017-12-13: qty 2

## 2017-12-13 NOTE — ED Provider Notes (Signed)
[facility] EMERGENCY DEPARTMENT Provider Note   CSN: [id number] Arrival date & time: 12/13/17  7829     History   Chief Complaint Chief Complaint  Patient presents with  . Dizziness    HPI Tyrez B Kautzman is a 57 y.o. male with past medical history of uncontrolled type 2 diabetes, diabetic neuropathy, hypertension, presenting to the emergency department with acute onset dizziness and imbalance.  Patient states he woke this morning at 4 AM to use the restroom and when he got up he had to sit back down because he felt as though he was going to fall on or pass out.  He states he feels as though the room is unsteady and he is unable to keep his balance.  This is made worse with movement of his head and any ambulation.  He has associated nonbloody nonbilious emesis with this dizziness.  He also complained of some tingling sensation to his left hand as well as left face that began today.  Reports his left upper extremity feels slightly weak as well.  He denies chest pain, shortness of breath, headache, vision changes.  His wife denies slurred speech or facial droop.  Patient denies history of vertigo.  No head trauma. Per chart review, patient with history of left upper and lower extremity weakness and ataxia for which he was evaluated by neurology in January 2017 with a normal work-up. The dizziness and significant imbalance with N/V is new today.  The history is provided by the patient, medical records and a relative.    Past Medical History:  Diagnosis Date  . Diabetes mellitus   . Neuropathy in diabetes [facility])     Patient Active Problem List   Diagnosis Date Noted  . Diabetic neuropathy (HCC) 03/02/2015  . Ataxia 02/25/2015  . Falls frequently 02/25/2015  . Neck pain 02/25/2015  . Left-sided weakness 02/25/2015  . Dysphagia 02/25/2015  . Uncontrolled type 2 diabetes mellitus with complication, without long-term current use of insulin (HCC) 11/28/2014  . Essential  hypertension, benign 11/28/2014  . Hyperlipidemia 11/28/2014  . Primary hypothyroidism 11/28/2014  . Vitamin D deficiency 11/28/2014    Past Surgical History:  Procedure Laterality Date  . ABSCESS DRAINAGE    . COLECTOMY    . HERNIA REPAIR  2007   Umbilical  . SHOULDER SURGERY    . TONSILLECTOMY  2000  . VASECTOMY          Home Medications    Prior to Admission medications   Medication Sig Start Date End Date Taking? Authorizing Provider  dapagliflozin propanediol (FARXIGA) 10 MG TABS tablet Take 10 mg by mouth daily.   Yes [provider]  gabapentin (NEURONTIN) 300 MG capsule Take 300-600 mg by mouth 2 (two) times daily. One in the morning and two at bedtime 01/16/13  Yes [provider]  gemfibrozil (LOPID) 600 MG tablet Take 1 tablet (600 mg total) by mouth 2 (two) times daily before a meal. 04/22/15  Yes Nida, Denman George, MD  LEVEMIR FLEXTOUCH 100 UNIT/ML Pen Inject 50 Units into the skin daily. evening 12/09/17  Yes [provider]  levothyroxine (SYNTHROID, LEVOTHROID) 175 MCG tablet Take 1 tablet (175 mcg total) by mouth daily before breakfast. 03/13/15  Yes Nida, Denman George, MD  Multiple Vitamins-Minerals (CENTRUM SILVER 50+MEN) TABS Take 1 tablet by mouth daily.   Yes [provider]  Omega-3 Fatty Acids (FISH OIL) 1200 MG CAPS Take by mouth.   Yes [provider]  omeprazole (PRILOSEC) 20 MG capsule Take 20 mg by mouth as needed (heartburn).    Yes [provider]  simvastatin (ZOCOR) 20 MG tablet Take 20 mg by mouth at bedtime. 12/26/12  Yes [provider]  B-D UF III MINI PEN NEEDLES 31G X 5 MM MISC use at bedtime 04/24/16   Roma Kayser, MD  Blood Glucose Monitoring Suppl (ACCU-CHEK AVIVA) device Use as instructed 03/13/15   Roma Kayser, MD  glucose blood (ACCU-CHEK AVIVA) test strip Use to test glucose 4 times a day 03/13/15   Roma Kayser, MD  Insulin Glargine (TOUJEO  SOLOSTAR) 300 UNIT/ML SOPN Inject 20 Units into the skin at bedtime. Patient not taking: Reported on 12/13/2017 03/13/15   Roma Kayser, MD  Insulin Pen Needle (PEN NEEDLES) 31G X 6 MM MISC 1 each by Does not apply route at bedtime. 03/19/15   Roma Kayser, MD  Liraglutide (VICTOZA) 18 MG/3ML SOPN Inject 0.3 mLs (1.8 mg total) into the skin daily. Patient not taking: Reported on 12/13/2017 08/05/15   Roma Kayser, MD  meclizine (ANTIVERT) 12.5 MG tablet Take 2 tablets (25 mg total) by mouth 3 (three) times daily as needed for dizziness. 12/13/17   Ediberto Sens, [place] N, PA-C  ondansetron (ZOFRAN ODT) 4 MG disintegrating tablet Take 1 tablet (4 mg total) by mouth every 8 (eight) hours as needed for nausea or vomiting. 12/13/17   Jeremian Whitby, [place] N, PA-C  Vitamin D, Ergocalciferol, (DRISDOL) 50000 UNITS CAPS capsule Take 1 capsule (50,000 Units total) by mouth every 7 (seven) days. Patient not taking: Reported on 12/13/2017 11/28/14   Roma Kayser, MD    Family History Family History  Problem Relation Age of Onset  . Hypertension Mother   . Hypertension Father   . Diabetes Father   . CAD Father   . Neuropathy Father   . Melanoma Paternal Grandmother     Social History Social History   Tobacco Use  . Smoking status: Never Smoker  Substance Use Topics  . Alcohol use: No  . Drug use: No     Allergies   Patient has no known allergies.   Review of Systems Review of Systems  Respiratory: Negative for shortness of breath.   Cardiovascular: Negative for chest pain.  Gastrointestinal: Positive for nausea and vomiting. Negative for abdominal pain.  Neurological: Positive for dizziness, weakness and light-headedness. Negative for syncope, facial asymmetry, speech difficulty, numbness and headaches.  All other systems reviewed and are negative.    Physical Exam Updated Vital Signs BP 140/81 (BP Location: Right Arm)   Pulse 78   Temp 97.7 F (36.5 C) (Oral)    Resp 12   SpO2 96%   Physical Exam  Constitutional: He is oriented to person, place, and time. He appears well-developed and well-nourished. No distress.  HENT:  Head: Normocephalic and atraumatic.  Eyes: Pupils are equal, round, and reactive to light. Conjunctivae and EOM are normal.  Cardiovascular: Normal rate, regular rhythm, normal heart sounds and intact distal pulses.  Pulmonary/Chest: Effort normal and breath sounds normal.  Abdominal: Soft. Bowel sounds are normal.  Neurological: He is alert and oriented to person, place, and time.  Mental Status:  Alert, oriented, thought content appropriate, able to give a coherent history. Speech fluent without evidence of aphasia. Able to follow 2 step commands without difficulty.  Cranial Nerves:  II:  Peripheral visual fields grossly normal, pupils equal, round, reactive to light III,IV, VI: ptosis not present, extra-ocular motions  intact bilaterally  V,VII: smile symmetric, facial light touch sensation equal VIII: hearing grossly normal to voice  X: uvula elevates symmetrically  XI: bilateral shoulder shrug symmetric and strong XII: midline tongue extension without fassiculations Motor:  Normal tone. 5/5 in upper and lower extremities. LUE and LLE with 4/5 strength with grip and dorsi/plantar flexion. RUE and RLE 5/5 strength.  Sensory: Pinprick and light touch normal in all extremities.  Deep Tendon Reflexes: 2+ and symmetric in the biceps and patella Cerebellar: some ataxia with LUE finger-to-nose with LLE heel-to-shin Unsteady gait CV: distal pulses palpable throughout    Skin: Skin is warm.  Psychiatric: He has a normal mood and affect. His behavior is normal.  Nursing note and vitals reviewed.    ED Treatments / Results  Labs (all labs ordered are listed, but only abnormal results are displayed) Labs Reviewed  URINALYSIS, ROUTINE W REFLEX MICROSCOPIC - Abnormal; Notable for the following components:      Result Value     Color, Urine STRAW (*)    Specific Gravity, Urine 1.031 (*)    Glucose, UA >=500 (*)    Ketones, ur 5 (*)    All other components within normal limits  CBG MONITORING, ED - Abnormal; Notable for the following components:   Glucose-Capillary 293 (*)    All other components within normal limits  CBC  BASIC METABOLIC PANEL    EKG None  Radiology Ct Head Wo Contrast  Result Date: 12/13/2017 CLINICAL DATA:  Dizziness beginning this morning with nausea and vomiting. No trauma. EXAM: CT HEAD WITHOUT CONTRAST TECHNIQUE: Contiguous axial images were obtained from the base of the skull through the vertex without intravenous contrast. COMPARISON:  Report of previous head CT 10/29/2015 FINDINGS: Brain: No evidence of acute infarction, hemorrhage, hydrocephalus, extra-axial collection or mass lesion/mass effect. Vascular: No hyperdense vessel or unexpected calcification. Skull: Normal. Negative for fracture or focal lesion. Sinuses/Orbits: No acute finding. Other: None. IMPRESSION: Normal head CT. Electronically Signed   By: Elberta Fortis M.D.   On: 12/13/2017 12:29   Mr Maxine Glenn Head Wo Contrast  Result Date: 12/13/2017 CLINICAL DATA:  Syncope.  Rule out stroke.  Diabetes.  Dizziness. EXAM: MRI HEAD WITHOUT CONTRAST MRA HEAD WITHOUT CONTRAST TECHNIQUE: Multiplanar, multiecho pulse sequences of the brain and surrounding structures were obtained without intravenous contrast. Angiographic images of the head were obtained using MRA technique without contrast. COMPARISON:  CT head 12/13/2017 FINDINGS: MRI HEAD FINDINGS Brain: Negative for acute infarct. Few small scattered white matter hyperintensities. Brainstem and cerebellum normal. Negative for hemorrhage or mass. Vascular: Normal arterial flow voids Skull and upper cervical spine: Negative Sinuses/Orbits: Negative Other: None MRA HEAD FINDINGS Both vertebral arteries are widely patent. Basilar widely patent. Superior cerebellar and posterior cerebral  arteries widely patent. Left PICA patent. Right PICA not visualized. Internal carotid artery widely patent bilaterally. Anterior and middle cerebral arteries widely patent bilaterally. Negative for cerebral aneurysm. IMPRESSION: 1. No acute intracranial abnormality. Mild chronic white matter changes 2. Negative MRA head Electronically Signed   By: Marlan Palau M.D.   On: 12/13/2017 15:05   Mr Brain Wo Contrast  Result Date: 12/13/2017 CLINICAL DATA:  Syncope.  Rule out stroke.  Diabetes.  Dizziness. EXAM: MRI HEAD WITHOUT CONTRAST MRA HEAD WITHOUT CONTRAST TECHNIQUE: Multiplanar, multiecho pulse sequences of the brain and surrounding structures were obtained without intravenous contrast. Angiographic images of the head were obtained using MRA technique without contrast. COMPARISON:  CT head 12/13/2017 FINDINGS: MRI HEAD FINDINGS Brain: Negative  for acute infarct. Few small scattered white matter hyperintensities. Brainstem and cerebellum normal. Negative for hemorrhage or mass. Vascular: Normal arterial flow voids Skull and upper cervical spine: Negative Sinuses/Orbits: Negative Other: None MRA HEAD FINDINGS Both vertebral arteries are widely patent. Basilar widely patent. Superior cerebellar and posterior cerebral arteries widely patent. Left PICA patent. Right PICA not visualized. Internal carotid artery widely patent bilaterally. Anterior and middle cerebral arteries widely patent bilaterally. Negative for cerebral aneurysm. IMPRESSION: 1. No acute intracranial abnormality. Mild chronic white matter changes 2. Negative MRA head Electronically Signed   By: Marlan Palau M.D.   On: 12/13/2017 15:05    Procedures Procedures (including critical care time)  Medications Ordered in ED Medications  ondansetron (ZOFRAN) injection 4 mg (4 mg Intravenous Given 12/13/17 1245)  meclizine (ANTIVERT) tablet 25 mg (25 mg Oral Given 12/13/17 1244)     Initial Impression / Assessment and Plan / ED Course  I have  reviewed the triage vital signs and the nursing notes.  Pertinent labs & imaging results that were available during my care of the patient were reviewed by me and considered in my medical decision making (see chart for details).  Clinical Course as of Dec 13 1544  [facility] Dec 13, 2017  1149 4am felt dizzy. Felt dizzy all mornign with movement, assoc nausea, left arm and face tingling   [JR]  1150 Exam: LUE and LLE weakness, abnormal finger to nose and heel to shin - ataxia.    [JR]    Clinical Course User Index [JR] Rondale Nies, [place] N, PA-C    Pt with history of chronic ataxia and left sided weakness, uncontrolled type 2 diabetes, presenting to the emergency department with acute onset of room spinning dizziness with imbalance and nausea/vomiting.  Also reporting some new paresthesias in the left arm and face.  No previous history of vertigo.  On exam he does have decreased strength in left upper and lower extremity, ataxia.  No cranial nerve deficits.  Unsteady gait.  Imaging ordered to rule out acute posterior circulation stroke.  MRI is negative for acute pathology.  Labs unremarkable. BMP hemolyzed, though low suspicion for acute electrolyte derangements. Patient treated with meclizine and Zofran with improvement.  Suspect symptoms likely secondary to a peripheral vertigo.  Patient is well-appearing and stable for discharge.  Recommend follow-up with his neurologist and discussed strict return precautions.  Patient discussed with Dr. Adela Lank.  Discussed results, findings, treatment and follow up. Patient advised of return precautions. Patient verbalized understanding and agreed with plan.   Final Clinical Impressions(s) / ED Diagnoses   Final diagnoses:  Vertigo    ED Discharge Orders         Ordered    ondansetron (ZOFRAN ODT) 4 MG disintegrating tablet  Every 8 hours PRN     12/13/17 1532    meclizine (ANTIVERT) 12.5 MG tablet  3 times daily PRN     12/13/17 1532            Miroslav Gin, [place] N, PA-C 12/13/17 1550    Melene Plan, DO 12/13/17 1559

## 2017-12-13 NOTE — Discharge Instructions (Addendum)
Please read instructions below. Drink plenty of water. You can take meclizine every needed for dizziness.  Be aware this medication may make you slightly drowsy. You can take Zofran every 8 hours as needed for nausea. Schedule appointment with your neurologist to follow-up on your visit today. Return to the emergency department if you are unable to control your vomiting, severe headache, vision changes, or new or concerning symptoms.

## 2017-12-13 NOTE — ED Notes (Signed)
ED Provider at bedside. 

## 2017-12-13 NOTE — ED Notes (Signed)
Patient transported to MRI 

## 2017-12-13 NOTE — ED Triage Notes (Signed)
Pt had near syncopal episode this morning when he got out of bed. After this episode pt experienced weakness, dizziness, and nausea. 1 episode of vomiting at work this morning. Hx diabetes CBG 311. Increased dizziness upon standing per EMS. Denies chest pain, shortness of breath.

## 2017-12-13 NOTE — ED Notes (Addendum)
Pt returned from CT scan to C hallway 6. Vital signs stable at this time.

## 2017-12-13 NOTE — ED Notes (Addendum)
Patient transported to CT scan . 

## 2018-01-13 ENCOUNTER — Other Ambulatory Visit: Payer: Self-pay | Admitting: "Endocrinology

## 2018-01-24 ENCOUNTER — Ambulatory Visit: Payer: BC Managed Care – PPO | Admitting: Neurology

## 2018-01-24 ENCOUNTER — Encounter: Payer: Self-pay | Admitting: Neurology

## 2018-01-24 VITALS — BP 104/70 | HR 100 | Ht 69.0 in | Wt 203.0 lb

## 2018-01-24 DIAGNOSIS — G250 Essential tremor: Secondary | ICD-10-CM

## 2018-01-24 DIAGNOSIS — R2689 Other abnormalities of gait and mobility: Secondary | ICD-10-CM | POA: Diagnosis not present

## 2018-01-24 DIAGNOSIS — E1142 Type 2 diabetes mellitus with diabetic polyneuropathy: Secondary | ICD-10-CM

## 2018-01-24 DIAGNOSIS — R270 Ataxia, unspecified: Secondary | ICD-10-CM

## 2018-01-24 DIAGNOSIS — R278 Other lack of coordination: Secondary | ICD-10-CM

## 2018-01-24 DIAGNOSIS — W19XXXD Unspecified fall, subsequent encounter: Secondary | ICD-10-CM

## 2018-01-24 DIAGNOSIS — R29898 Other symptoms and signs involving the musculoskeletal system: Secondary | ICD-10-CM | POA: Diagnosis not present

## 2018-01-24 NOTE — Progress Notes (Signed)
[facility]    Provider:  Dr Jaynee Eagles Referring Provider: Lanelle Bal PA-C Primary Care Physician:  Rory Percy, MD  CC:  Poor balance.   Interval history 01/24/2018: Patient is here for follow up. He has seen me before in 2017 for multiple issues including ataxia/poor balance, distal sensory loss in the setting of uncontrolled diabetes, left-sided subjective weakness, falls, he had stopped taking all his medications except neurontin at that time and hgba1c was > 11. [name] MRI of the Braina nd MRA of the head in 12/2017 were unremarkable. In the past I had recommended an MRi of the cervical spine and emg/ncs which patient did not complete. Labwork included Lyme, vitamin B1, multiple myeloma panel, vitamin B6, heavy metals, rheumatoid factor, hep C, RPR, ANA with reflex all of which were unremarkable.  Patient had uncontrolled diabetes last hemoglobin A1c when I initially saw him was greater than 11.  PMHx of uncontrolled DM, hypothyroidism, HLD, HTN. He denies No significant headaches, diplopia, aphasia, focal weakness, facial droop. He endorsed getting choked on swallowing sometimes with saliva or liquids and tremors.   Today he is here with his wife. He says it was a wake-up call for him. He had acute onset dizziness. He has lost weight. He is stopping soda. Last HgbA1c was 11.2 right before in November. His feet hurt. He has balance issues. He was spinning, worse when turned his head. He was vomiting. He went to the ED. His blood pressure was elevated as well. He was not taking medications for blood pressure. The dizziness improved with epley maneuver. Meclizine and zofran helped and he doesn't need that again. He had another episode.    He was seen in the emergency room December 13, 2017.  Reviewed notes.  He had acute onset dizziness and imbalance.  He also had associated emesis with his dizziness.  He also complained of some tingling sensation to his left hand as well as left  face.  Exam did show some left-sided weakness with grip and dorsal plantar flexion.  Otherwise exam was normal with normal reflexes in the biceps and patellar.  He had some ataxia with left upper extremity finger-to-nose and with left lower extremity heel-to-shin and unsteady gait.  MRI was negative.  Diagnosed with peripheral vertigo.  Glucose was 293.    [facility] which included patient being seen at [facility] emergency room on December 13, 2017 for dizziness and vomiting.  At the time his blood pressure and glucose were quite elevated.  He was given meclizine and Zofran from the emergency room.  He was referred back to me his blood sugar is still uncontrolled.  HPI:  Joseph Chavez is a 57 y.o. male here as a referral from Dr. Nadara Mustard for multiple issues. PMHx of uncontrolled DM, hypothyroidism, HLD, His wife is here with him. He has been falling. He has poor balance. Memory has been funny. Symptoms started over the summer. 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 He just went down, he tripped over his feet. He has uncontrolled diabetes. He stopped taking his medication and his last HgbA1c is 11.2. Takes neurontin for neuropathy. 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 The pain is severe, tingling, burning, numbness. He has cramping in the feet. The symptoms are continuous and progressing and slowly worsening over the years. He is hypothyroid and his TSH is elevated. No  significant headaches, diplopia, aphasia, focal weakness, facial droop. He has had uncontrolled glucose for 5 or more years. He says he has speaking problems, his speech "catches". He has fatigue. He reports getting choked on swallowing sometimes with saliva or liquids. He has some occ.neck pain and LBP but no radicular symptoms.  Father with diabetic neuropathy. He reports tremors, he shakes with his hands a lot of the time per wife.  Propranolol may hellp, he is unsure. His handwriting is poor. No vision changes. Denies smoking, denies current alcohol intake. In the past more social drinking, < 1 a day. He had part of his colon removed due to polyps. He is slow walker, has to look at his feet to see what is under his feet.   Reviewed notes, labs and imaging from outside physicians, which showed:  HgbA1c 11.2 BMP with glucose 462 Creatinine 0.79 01/2015 LFTs wnl TSH 7.820 LDL 88 B12 346  MRI of the brain: personally reviewed images: essentially normal MRI of the brain, a few foci of t2 hyperintensity may be non-specific chronic microvascular changes   Review of Systems: Patient complains of symptoms per HPI as well as the following symptoms: fatigue, blurred visiuon, increased thirst, hearing loss, ringing in ears, joint pain, aching muscles, memory loss, cinfusion, numbness, weakness, dififculty swallowing, dizziness, tremor, restless legs, decreased energy . Pertinent negatives per HPI. All others negative.   Social History   Socioeconomic History  . Marital status: Married    Spouse name: Seth Bake  . Number of children: 4  . Years of education: 12+, has all hours for masters  . Highest education level: Bachelor's degree (e.g., BA, AB, BS)  Occupational History  . Not on file  Social Needs  . Financial resource strain: Not on file  . Food insecurity:    Worry: Not on file    Inability: Not on file  . Transportation needs:    Medical: Not on file    Non-medical: Not on file  Tobacco Use  . Smoking status: Never Smoker  . Smokeless tobacco: Never Used  Substance and Sexual Activity  . Alcohol use: No    Frequency: Never    Comment: quit 2012  . Drug use: No  . Sexual activity: Not on file  Lifestyle  . Physical activity:    Days per week: Not on file    Minutes per session: Not on file  . Stress: Not on file  Relationships  . Social connections:    Talks on phone: Not on file    Gets together:  Not on file    Attends religious service: Not on file    Active member of club or organization: Not on file    Attends meetings of clubs or organizations: Not on file    Relationship status: Not on file  . Intimate partner violence:    Fear of current or ex partner: Not on file    Emotionally abused: Not on file    Physically abused: Not on file    Forced sexual activity: Not on file  Other Topics Concern  . Not on file  Social History Narrative   Lives with wife and son   Caffeine use:  20 oz drinks per day   Right handed    Family History  Problem Relation Age of Onset  . Hypertension Mother   . Hypertension Father   . Diabetes Father   . CAD Father   . Neuropathy Father   . Congestive Heart Failure Father   .  Melanoma Paternal Grandmother   . Cancer Maternal Grandmother     Past Medical History:  Diagnosis Date  . Diabetes mellitus   . High cholesterol   . Hypertension   . Hypothyroidism   . Neuropathy in diabetes [facility])     Past Surgical History:  Procedure Laterality Date  . ABSCESS DRAINAGE    . COLECTOMY  2016   partial  . HERNIA REPAIR  3888   Umbilical  . SHOULDER SURGERY Left 2016  . TONSILLECTOMY  2000  . VASECTOMY      Current Outpatient Medications  Medication Sig Dispense Refill  . B-D UF III MINI PEN NEEDLES 31G X 5 MM MISC use at bedtime 100 each 5  . Blood Glucose Monitoring Suppl (ACCU-CHEK AVIVA) device Use as instructed 1 each 0  . dapagliflozin propanediol (FARXIGA) 10 MG TABS tablet Take 10 mg by mouth daily.    . Dulaglutide (TRULICITY [place]) Inject 1 pen into the skin once a week.    . gabapentin (NEURONTIN) 300 MG capsule Take by mouth. One in the morning and two at bedtime    . gemfibrozil (LOPID) 600 MG tablet Take 1 tablet (600 mg total) by mouth 2 (two) times daily before a meal. 60 tablet 2  . glucose blood (ACCU-CHEK AVIVA) test strip Use to test glucose 4 times a day 150 each 3  . Insulin Pen Needle (PEN NEEDLES) 31G X 6 MM MISC  1 each by Does not apply route at bedtime. 100 each 5  . LEVEMIR FLEXTOUCH 100 UNIT/ML Pen Inject 50 Units into the skin daily. evening  5  . levothyroxine (SYNTHROID, LEVOTHROID) 175 MCG tablet Take 1 tablet (175 mcg total) by mouth daily before breakfast. 30 tablet 3  . lisinopril (PRINIVIL,ZESTRIL) 20 MG tablet Take 20 mg by mouth daily.    . meclizine (ANTIVERT) 12.5 MG tablet Take 2 tablets (25 mg total) by mouth 3 (three) times daily as needed for dizziness. 60 tablet 0  . Omega-3 Fatty Acids (FISH OIL) 1200 MG CAPS Take by mouth.    . ondansetron (ZOFRAN ODT) 4 MG disintegrating tablet Take 1 tablet (4 mg total) by mouth every 8 (eight) hours as needed for nausea or vomiting. 20 tablet 0  . simvastatin (ZOCOR) 20 MG tablet Take 20 mg by mouth at bedtime.    . Multiple Vitamins-Minerals (CENTRUM SILVER 50+MEN) TABS Take 1 tablet by mouth daily.    [name] omeprazole (PRILOSEC) 20 MG capsule Take 20 mg by mouth as needed (heartburn).      No current facility-administered medications for this visit.     Allergies as of 01/24/2018  . (No Known Allergies)    Vitals: BP 104/70 (BP Location: Right Arm, Patient Position: Sitting)   Pulse 100   Ht '5\' 9"'  (1.753 m)   Wt 203 lb (92.1 kg)   BMI 29.98 kg/m  Last Weight:  Wt Readings from Last 1 Encounters:  01/24/18 203 lb (92.1 kg)   Last Height:   Ht Readings from Last 1 Encounters:  01/24/18 '5\' 9"'  (1.753 m)    Physical exam: Exam: Gen: NAD, conversant, well nourised, overweight, well groomed                     CV: RRR, no MRG. No Carotid Bruits. No peripheral edema, warm, nontender Eyes: Conjunctivae clear without exudates or hemorrhage  Neuro: Detailed Neurologic Exam  Speech:    Speech is normal; fluent and spontaneous with normal comprehension.  Cognition:    The patient is oriented to person, place, and time;     recent and remote memory intact;     language fluent;     normal attention, concentration,     fund of  knowledge Cranial Nerves:    The pupils are equal, round, and reactive to light. The fundi are flat Visual fields are full to finger confrontation. Extraocular movements are intact. Trigeminal sensation is intact and the muscles of mastication are normal. The face is symmetric. The palate elevates in the midline. Hearing intact. Voice is normal. Shoulder shrug is normal. The tongue has normal motion without fasciculations.   Coordination:    dysmetria HTS. Slow on FTN.  Gait:    Wide-based, difficulty with heel, toe and tandem.   Motor Observation: High frequency, low amplitude postural UE tremor. Left forearm fasciculations.  Tone:    Normal muscle tone.    Posture:    Posture is normal. normal erect    Strength: Left Biceps 5-/5, left deltoid 4+/5, left grip weakness. His strength in the legs appear intact except 4/5 dorsiflexion weakness.  Sensation: Decr pp and temp tio the knees. Absent vibration to the medial malleoli where <1 second vibration. Absent proprioception in the feet.     Reflex Exam:  DTR's: Absent AJs. Brisker left brachiradialis. Hyporeflexic otherwise.    Toes:    The toes are equivocal bilaterally.   Clonus:    Clonus is absent.      Assessment/Plan:  57 year old male whom I saw in 2017 due with hx of uncontrolled diabetes (last hgba1c 11.2), sensory ataxia, left-sided weakness, distal dorsiflexion bilat weakness, dysmetria, significant sensory impairment in the legs in pp, temp, vibration and proprioception. MRI of the brain was unremarkable. He did not follow my recommendations for MRI cervical spine, emg/ncs and managing his diabetes. He still has uncontrolled diabetes however after a severe episode of vertigo he is doing extremely well with weight loss and changing his habits on medication management. Was at ED for dizziness with elevated BP and elevated glucose.    - The sensory ataxia and distal weakness likely secondary to uncontrolled diabetic  peripheral neuropathy. Serum testing in the past for other etiologies negative.   - Unclear etiology of left arm weakness and brisk brachioradialis. MRI cervical spine to eval for myelopathy given his ataxia and falls and cervical degenerative changes.   - Consider emg/ncs left arm if mri doesn't show etiology  - essential tremor: propranolol helped in the past. Check mri cervical spine.   - alpha lipoic acid for diabetic neuropathy  - Vertigo: improved with epley maneuvres and we can send to PT for vertigo and vestib rehab if needed  - daily asa 46m, discussed stroke prevention  - stroke prevention   Orders Placed This Encounter  Procedures  . MR CERVICAL SPINE WO CONTRAST    CC: Dr. HNadara Mustardand EWyline Copas MD  [facility] [address] [place] [address] Phone [phone] [phone] A total of 45 minutes was spent face-to-face with this patient. Over half this time was spent on counseling patient on the  1. Diabetic polyneuropathy associated with type 2 diabetes mellitus ([place]   2. Ataxia   3. Left arm weakness   4. Fall, subsequent encounter   5. Imbalance   6. Essential tremor     diagnosis and different diagnostic and therapeutic options, counseling and coordination of care, risks ans benefits  of management, compliance, or risk factor reduction and education.

## 2018-01-24 NOTE — Patient Instructions (Addendum)
-May consider daily alpha lipoic acid which is an antioxidant that may reduce free radical oxidative stress associated with diabetic polyneuropathy, existing evidence suggests that alpha lipoic acid significantly reduces stabbing, lancinating and burning pain and diabetic neuropathy with its onset of action as early as 1-2 weeks. 600mg  a day.  MRI cervical spine Daily baby aspirin for stroke preventio  Vertigo Vertigo is the feeling that you or your surroundings are moving when they are not. Vertigo can be dangerous if it occurs while you are doing something that could endanger you or others, such as driving. What are the causes? This condition is caused by a disturbance in the signals that are sent by your body's sensory systems to your brain. Different causes of a disturbance can lead to vertigo, including:  Infections, especially in the inner ear.  A bad reaction to a drug, or misuse of alcohol and medicines.  Withdrawal from drugs or alcohol.  Quickly changing positions, as when lying down or rolling over in bed.  Migraine headaches.  Decreased blood flow to the brain.  Decreased blood pressure.  Increased pressure in the brain from a head or neck injury, stroke, infection, tumor, or bleeding.  Central nervous system disorders.  What are the signs or symptoms? Symptoms of this condition usually occur when you move your head or your eyes in different directions. Symptoms may start suddenly, and they usually last for less than a minute. Symptoms may include:  Loss of balance and falling.  Feeling like you are spinning or moving.  Feeling like your surroundings are spinning or moving.  Nausea and vomiting.  Blurred vision or double vision.  Difficulty hearing.  Slurred speech.  Dizziness.  Involuntary eye movement (nystagmus).  Symptoms can be mild and cause only slight annoyance, or they can be severe and interfere with daily life. Episodes of vertigo may return  (recur) over time, and they are often triggered by certain movements. Symptoms may improve over time. How is this diagnosed? This condition may be diagnosed based on medical history and the quality of your nystagmus. Your health care provider may test your eye movements by asking you to quickly change positions to trigger the nystagmus. This may be called the Dix-Hallpike test, head thrust test, or roll test. You may be referred to a health care provider who specializes in ear, nose, and throat (ENT) problems (otolaryngologist) or a provider who specializes in disorders of the central nervous system (neurologist). You may have additional testing, including:  A physical exam.  Blood tests.  MRI.  A CT scan.  An electrocardiogram (ECG). This records electrical activity in your heart.  An electroencephalogram (EEG). This records electrical activity in your brain.  Hearing tests.  How is this treated? Treatment for this condition depends on the cause and the severity of the symptoms. Treatment options include:  Medicines to treat nausea or vertigo. These are usually used for severe cases. Some medicines that are used to treat other conditions may also reduce or eliminate vertigo symptoms. These include: ? Medicines that control allergies (antihistamines). ? Medicines that control seizures (anticonvulsants). ? Medicines that relieve depression (antidepressants). ? Medicines that relieve anxiety (sedatives).  Head movements to adjust your inner ear back to normal. If your vertigo is caused by an ear problem, your health care provider may recommend certain movements to correct the problem.  Surgery. This is rare.  Follow these instructions at home: Safety  Move slowly.Avoid sudden body or head movements.  Avoid driving.  Avoid  operating heavy machinery.  Avoid doing any tasks that would cause danger to you or others if you would have a vertigo episode during the task.  If you have  trouble walking or keeping your balance, try using a cane for stability. If you feel dizzy or unstable, sit down right away.  Return to your normal activities as told by your health care provider. Ask your health care provider what activities are safe for you. General instructions  Take over-the-counter and prescription medicines only as told by your health care provider.  Avoid certain positions or movements as told by your health care provider.  Drink enough fluid to keep your urine clear or pale yellow.  Keep all follow-up visits as told by your health care provider. This is important. Contact a health care provider if:  Your medicines do not relieve your vertigo or they make it worse.  You have a fever.  Your condition gets worse or you develop new symptoms.  Your family or friends notice any behavioral changes.  Your nausea or vomiting gets worse.  You have numbness or a "pins and needles" sensation in part of your body. Get help right away if:  You have difficulty moving or speaking.  You are always dizzy.  You faint.  You develop severe headaches.  You have weakness in your hands, arms, or legs.  You have changes in your hearing or vision.  You develop a stiff neck.  You develop sensitivity to light. This information is not intended to replace advice given to you by your health care provider. Make sure you discuss any questions you have with your health care provider. Document Released: 11/05/2004 Document Revised: 07/10/2015 Document Reviewed: 05/21/2014 Elsevier Interactive Patient Education  2018 [employer].   Stroke Prevention Some medical conditions and behaviors are associated with a higher chance of having a stroke. You can help prevent a stroke by making nutrition, lifestyle, and other changes, including managing any medical conditions you may have. What nutrition changes can be made?  Eat healthy foods. You can do this by: ? Choosing foods high  in fiber, such as fresh fruits and vegetables and whole grains. ? Eating at least 5 or more servings of fruits and vegetables a day. Try to fill half of your plate at each meal with fruits and vegetables. ? Choosing lean protein foods, such as lean cuts of meat, poultry without skin, fish, tofu, beans, and nuts. ? Eating low-fat dairy products. ? Avoiding foods that are high in salt (sodium). This can help lower blood pressure. ? Avoiding foods that have saturated fat, trans fat, and cholesterol. This can help prevent high cholesterol. ? Avoiding processed and premade foods.  Follow your health care provider's specific guidelines for losing weight, controlling high blood pressure (hypertension), lowering high cholesterol, and managing diabetes. These may include: ? Reducing your daily calorie intake. ? Limiting your daily sodium intake to 1,500 milligrams (mg). ? Using only healthy fats for cooking, such as olive oil, canola oil, or sunflower oil. ? Counting your daily carbohydrate intake. What lifestyle changes can be made?  Maintain a healthy weight. Talk to your health care provider about your ideal weight.  Get at least 30 minutes of moderate physical activity at least 5 days a week. Moderate activity includes brisk walking, biking, and swimming.  Do not use any products that contain nicotine or tobacco, such as cigarettes and e-cigarettes. If you need help quitting, ask your health care provider. It may also be helpful  to avoid exposure to secondhand smoke.  Limit alcohol intake to no more than 1 drink a day for nonpregnant women and 2 drinks a day for men. One drink equals 12 oz of beer, 5 oz of wine, or 1 oz of hard liquor.  Stop any illegal drug use.  Avoid taking birth control pills. Talk to your health care provider about the risks of taking birth control pills if: ? You are over 99 years old. ? You smoke. ? You get migraines. ? You have ever had a blood clot. What other  changes can be made?  Manage your cholesterol levels. ? Eating a healthy diet is important for preventing high cholesterol. If cholesterol cannot be managed through diet alone, you may also need to take medicines. ? Take any prescribed medicines to control your cholesterol as told by your health care provider.  Manage your diabetes. ? Eating a healthy diet and exercising regularly are important parts of managing your blood sugar. If your blood sugar cannot be managed through diet and exercise, you may need to take medicines. ? Take any prescribed medicines to control your diabetes as told by your health care provider.  Control your hypertension. ? To reduce your risk of stroke, try to keep your blood pressure below 130/80. ? Eating a healthy diet and exercising regularly are an important part of controlling your blood pressure. If your blood pressure cannot be managed through diet and exercise, you may need to take medicines. ? Take any prescribed medicines to control hypertension as told by your health care provider. ? Ask your health care provider if you should monitor your blood pressure at home. ? Have your blood pressure checked every year, even if your blood pressure is normal. Blood pressure increases with age and some medical conditions.  Get evaluated for sleep disorders (sleep apnea). Talk to your health care provider about getting a sleep evaluation if you snore a lot or have excessive sleepiness.  Take over-the-counter and prescription medicines only as told by your health care provider. Aspirin or blood thinners (antiplatelets or anticoagulants) may be recommended to reduce your risk of forming blood clots that can lead to stroke.  Make sure that any other medical conditions you have, such as atrial fibrillation or atherosclerosis, are managed. What are the warning signs of a stroke? The warning signs of a stroke can be easily remembered as BEFAST.  B is for balance. Signs  include: ? Dizziness. ? Loss of balance or coordination. ? Sudden trouble walking.  E is for eyes. Signs include: ? A sudden change in vision. ? Trouble seeing.  F is for face. Signs include: ? Sudden weakness or numbness of the face. ? The face or eyelid drooping to one side.  A is for arms. Signs include: ? Sudden weakness or numbness of the arm, usually on one side of the body.  S is for speech. Signs include: ? Trouble speaking (aphasia). ? Trouble understanding.  T is for time. ? These symptoms may represent a serious problem that is an emergency. Do not wait to see if the symptoms will go away. Get medical help right away. Call your local emergency services (911 in the [place] Do not drive yourself to the hospital.  Other signs of stroke may include: ? A sudden, severe headache with no known cause. ? Nausea or vomiting. ? Seizure.  Where to find more information: For more information, visit:  American Stroke Association: www.strokeassociation.org  National Stroke Association: www.stroke.org  Summary  You can prevent a stroke by eating healthy, exercising, not smoking, limiting alcohol intake, and managing any medical conditions you may have.  Do not use any products that contain nicotine or tobacco, such as cigarettes and e-cigarettes. If you need help quitting, ask your health care provider. It may also be helpful to avoid exposure to secondhand smoke.  Remember BEFAST for warning signs of stroke. Get help right away if you or a loved one has any of these signs. This information is not intended to replace advice given to you by your health care provider. Make sure you discuss any questions you have with your health care provider. Document Released: 03/05/2004 Document Revised: 03/03/2016 Document Reviewed: 03/03/2016 Elsevier Interactive Patient Education  2018 [employer]    Essential Tremor A tremor is trembling or shaking that you cannot control. Most  tremors affect the hands or arms. Tremors can also affect the head, vocal cords, face, and other parts of the body. Essential tremor is a tremor without a known cause. What are the causes? Essential tremor has no known cause. What increases the risk? You may be at greater risk of essential tremor if: You have a family member with essential tremor. You are age 58 or older. You take certain medicines.  What are the signs or symptoms? The main sign of a tremor is uncontrolled and unintentional rhythmic shaking of a body part. You may have difficulty eating with a spoon or fork. You may have difficulty writing. You may nod your head up and down or side to side. You may have a quivering voice.  Your tremors: May get worse over time. May come and go. May be more noticeable on one side of your body. May get worse due to stress, fatigue, caffeine, and extreme heat or cold.  How is this diagnosed? Your health care provider can diagnose essential tremor based on your symptoms, medical history, and a physical examination. There is no single test to diagnose an essential tremor. However, your health care provider may perform a variety of tests to rule out other conditions. Tests may include: Blood and urine tests. Imaging studies of your brain, such as: CT scan. MRI. A test that measures involuntary muscle movement (electromyogram).  How is this treated? Your tremors may go away without treatment. Mild tremors may not need treatment if they do not affect your day-to-day life. Severe tremors may need to be treated using one or a combination of the following options: Medicines. This may include medicine that is injected. Lifestyle changes. Physical therapy.  Follow these instructions at home: Take medicines only as directed by your health care provider. Limit alcohol intake to no more than 1 drink per day for nonpregnant women and 2 drinks per day for men. One drink equals 12 oz of beer, 5  oz of wine, or 1 oz of hard liquor. Do not use any tobacco products, including cigarettes, chewing tobacco, or electronic cigarettes. If you need help quitting, ask your health care provider. Take medicines only as directed by your health care provider. Avoid extreme heat or cold. Limit the amount of caffeine you consumeas directed by your health care provider. Try to get eight hours of sleep each night. Find ways to manage your stress, such as meditation or yoga. Keep all follow-up visits as directed by your health care provider. This is important. This includes any physical therapy visits. Contact a health care provider if: You experience any changes in the location or intensity of your tremors.  You start having a tremor after starting a new medicine. You have tremor with other symptoms such as: Numbness. Tingling. Pain. Weakness. Your tremor gets worse. Your tremor interferes with your daily life. This information is not intended to replace advice given to you by your health care provider. Make sure you discuss any questions you have with your health care provider. Document Released: 02/16/2014 Document Revised: 07/04/2015 Document Reviewed: 07/24/2013 Elsevier Interactive Patient Education  [employer].

## 2018-01-25 ENCOUNTER — Telehealth: Payer: Self-pay | Admitting: Neurology

## 2018-01-25 DIAGNOSIS — R29898 Other symptoms and signs involving the musculoskeletal system: Secondary | ICD-10-CM | POA: Insufficient documentation

## 2018-01-25 NOTE — Telephone Encounter (Signed)
[employer] Auth: [id number] (exp. 01/25/18 to 02/23/18) lvm to home & mobile for pt to call back about scheduling mri

## 2018-01-26 NOTE — Telephone Encounter (Signed)
Patient returned my call he is scheduled at [facility] for 02/01/18 arrival time is 11:30 AM patient is aware of time & day.. I also gave him their number of [phone] incase he needed to r/s.

## 2018-02-01 ENCOUNTER — Ambulatory Visit (HOSPITAL_COMMUNITY)
Admission: RE | Admit: 2018-02-01 | Discharge: 2018-02-01 | Disposition: A | Discharge status: Home / Self Care | Payer: BC Managed Care – PPO | Source: Ambulatory Visit | Attending: Neurology | Admitting: Neurology

## 2018-02-01 DIAGNOSIS — R270 Ataxia, unspecified: Secondary | ICD-10-CM

## 2018-02-01 DIAGNOSIS — W19XXXD Unspecified fall, subsequent encounter: Secondary | ICD-10-CM | POA: Diagnosis not present

## 2018-02-01 DIAGNOSIS — M47812 Spondylosis without myelopathy or radiculopathy, cervical region: Secondary | ICD-10-CM | POA: Insufficient documentation

## 2018-02-01 DIAGNOSIS — R2689 Other abnormalities of gait and mobility: Secondary | ICD-10-CM

## 2018-02-01 DIAGNOSIS — R29898 Other symptoms and signs involving the musculoskeletal system: Secondary | ICD-10-CM | POA: Diagnosis not present

## 2018-02-02 ENCOUNTER — Telehealth: Payer: Self-pay | Admitting: Neurology

## 2018-02-02 NOTE — Telephone Encounter (Signed)
Joseph Chavez, MRI of the cervical spine shows arthritic changes that can cause neck and arm pain but his cervical spine looks normal and there is no other cause of his symptoms seen here. As we discussed in the appointment,  his imbalance and distal weakness  likely secondary to uncontrolled diabetic peripheral neuropathy. Extensive lab testing in the past, MRI brain, and now MRI neck,  Negative for other etiologies. I hope he keeps up with his new positive outlook on his weight and management of his medical conditions.  FYI results MRI c-spine below Disc levels:  Discs: Degenerative disc disease with disc height loss at C4-5 and C6-7.  C2-3: No significant disc bulge. No neural foraminal stenosis. No central canal stenosis.  C3-4: Mild broad-based disc bulge with a small central disc protrusion. Right uncovertebral degenerative changes. Moderate right foraminal stenosis. No central canal stenosis.  C4-5: Broad right paracentral disc protrusion deforming the right paracentral ventral spinal cord. Severe right foraminal stenosis. Severe left foraminal stenosis. Mild spinal stenosis.  C5-6: Minimal broad-based disc bulge. Moderate left facet arthropathy. No neural foraminal stenosis. No central canal stenosis.  C6-7: Mild broad-based disc bulge. no neural foraminal stenosis. No central canal stenosis.  C7-T1: Mild broad-based disc bulge. Mild left foraminal stenosis. No right foraminal stenosis. No neural foraminal stenosis. No central canal stenosis.  T1-2: Moderate bilateral facet arthropathy.

## 2018-02-03 NOTE — Telephone Encounter (Signed)
I called pt and advised him of his MRI cervical spine. Pt will continue with weight and medical management of his chronic conditions. Pt verbalized understanding of results. I reminded him of his appt in June with Dr. Lucia GaskinsAhern. Pt had no questions at this time but was encouraged to call back if questions arise.

## 2018-02-16 ENCOUNTER — Institutional Professional Consult (permissible substitution): Payer: BC Managed Care – PPO | Admitting: Neurology

## 2018-07-21 ENCOUNTER — Telehealth: Payer: Self-pay | Admitting: *Deleted

## 2018-07-21 NOTE — Telephone Encounter (Signed)
Called pt & LVM (ok per DPR) regarding Tues 6/16 2 pm appt to r/s patient to Amy NP. Advised we have same day availabilities at this time. Also advised our office is doing VV due to [place] pandemic. Asked for a call back to discuss. Left office number and hours in message and advised if we do not receive a call back by Monday, the appointment will be canceled and he can call back to r/s for next available.  When pt calls back, please r/s to Amy NP for this follow-up and also obtain consent and set him up for a doxy visit.

## 2018-07-21 NOTE — Telephone Encounter (Signed)
Spoke with pt on mobile # and discussed the message below. He r/s to Amy at 9:00 AM 6/16 and consented to a mychart video visit. I sent the mychart setup information to his cell phone [phone] d/t patient using his phone for the visit.  Pt understands that although there may be some limitations with this type of visit, we will take all precautions to reduce any security or privacy concerns.  Pt understands that this will be treated like an in office visit and we will file with pt's insurance, and there may be a patient responsible charge related to this service. He understands he will check-in through [facility] at the time of the appointment. Help desk number will be on mychart if he has any technical issues. He verbalized appreciation for the call.

## 2018-07-26 ENCOUNTER — Encounter: Payer: Self-pay | Admitting: Family Medicine

## 2018-07-26 ENCOUNTER — Telehealth (INDEPENDENT_AMBULATORY_CARE_PROVIDER_SITE_OTHER): Payer: BC Managed Care – PPO | Admitting: Family Medicine

## 2018-07-26 ENCOUNTER — Ambulatory Visit: Payer: BC Managed Care – PPO | Admitting: Neurology

## 2018-07-26 DIAGNOSIS — E1142 Type 2 diabetes mellitus with diabetic polyneuropathy: Secondary | ICD-10-CM | POA: Diagnosis not present

## 2018-07-26 NOTE — Progress Notes (Addendum)
PATIENT: Joseph Chavez DOB: [birth year]  REASON FOR VISIT: follow up HISTORY FROM: patient  Virtual Visit via Telephone Note  I connected with Joseph Chavez on 07/26/18 at  9:00 AM EDT by telephone and verified that I am speaking with the correct person using two identifiers.   I discussed the limitations, risks, security and privacy concerns of performing an evaluation and management service by telephone and the availability of in person appointments. I also discussed with the patient that there may be a patient responsible charge related to this service. The patient expressed understanding and agreed to proceed.   History of Present Illness:  07/26/18 Joseph Chavez is a 58 y.o. male here today for follow up for diabetic neuropathy.  He reports that since his last visit with Joseph. Lavell Chavez he has been fairly stable.  He did start out for left Perlick acid for neuropathy.  He feels that if he takes this consistently it does help.  He continues gabapentin 300 mg in the morning and 600 mg in the evenings.  This also helps with the tingling sensation.  He states that his dizziness is much improved.  He has had 1 small episode of dizziness since his last visit.  This resolved spontaneously.  He states that he continues to have mild weakness of his left upper extremity.  He does feel that this is fairly stable and has not worsened.  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  He admits to about a 6 pound weight gain.  He does not routinely check blood sugars at home.  He thinks his last A1c was around 8.   History (copied from Joseph Chavez note on 01/24/2018)  Interval history 01/24/2018: Patient is here for follow up. He has seen me before in 2017 for multiple issues including ataxia/poor balance, distal sensory loss in the setting of uncontrolled diabetes, left-sided subjective weakness, falls, he had stopped taking all his medications except neurontin at that time and hgba1c  was > 11. [name] MRI of the Braina nd MRA of the head in 12/2017 were unremarkable. In the past I had recommended an MRi of the cervical spine and emg/ncs which patient did not complete. Labwork included Lyme, vitamin B1, multiple myeloma panel, vitamin B6, heavy metals, rheumatoid factor, hep C, RPR, ANA with reflex all of which were unremarkable.  Patient had uncontrolled diabetes last hemoglobin A1c when I initially saw him was greater than 11.  PMHx of uncontrolled DM, hypothyroidism, HLD, HTN. He denies No significant headaches, diplopia, aphasia, focal weakness, facial droop. He endorsed getting choked on swallowing sometimes with saliva or liquids and tremors.   Today he is here with his wife. He says it was a wake-up call for him. He had acute onset dizziness. He has lost weight. He is stopping soda. Last HgbA1c was 11.2 right before in November. His feet hurt. He has balance issues. He was spinning, worse when turned his head. He was vomiting. He went to the ED. His blood pressure was elevated as well. He was not taking medications for blood pressure. The dizziness improved with epley maneuver. Meclizine and zofran helped and he doesn't need that again. He had another episode.    He was seen in the emergency room December 13, 2017.  Reviewed notes.  He had acute onset dizziness and imbalance.  He also had associated emesis with his dizziness.  He also complained of some tingling sensation to his left  hand as well as left face.  Exam did show some left-sided weakness with grip and dorsal plantar flexion.  Otherwise exam was normal with normal reflexes in the biceps and patellar.  He had some ataxia with left upper extremity finger-to-nose and with left lower extremity heel-to-shin and unsteady gait.  MRI was negative.  Diagnosed with peripheral vertigo.  Glucose was 293.    [facility] referral which included patient being seen at the [facility] emergency room on December 13, 2017  for dizziness and vomiting.  At the time his blood pressure and glucose were quite elevated.  He was given meclizine and Zofran from the emergency room.  He was referred back to me his blood sugar is still uncontrolled.  HPI:  Joseph Chavez is a 58 y.o. male here as a referral from Joseph. Nadara Chavez for multiple issues. PMHx of uncontrolled DM, hypothyroidism, HLD, His wife is here with him. He has been falling. He has poor balance. Memory has been funny. Symptoms started over the summer. 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 He just went down, he tripped over his feet. He has uncontrolled diabetes. He stopped taking his medication and his last HgbA1c is 11.2. Takes neurontin for neuropathy. 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 The pain is severe, tingling, burning, numbness. He has cramping in the feet. The symptoms are continuous and progressing and slowly worsening over the years. He is hypothyroid and his TSH is elevated. No significant headaches, diplopia, aphasia, focal weakness, facial droop. He has had uncontrolled glucose for 5 or more years. He says he has speaking problems, his speech "catches". He has fatigue. He reports getting choked on swallowing sometimes with saliva or liquids. He has some occ.neck pain and LBP but no radicular symptoms.  Father with diabetic neuropathy. He reports tremors, he shakes with his hands a lot of the time per wife. Propranolol may hellp, he is unsure. His handwriting is poor. No vision changes. Denies smoking, denies current alcohol intake. In the past more social drinking, < 1 a day. He had part of his colon removed due to polyps. He is slow walker, has to look at his feet to see what is under his feet.   Reviewed notes, labs and imaging from outside physicians, which showed:  HgbA1c 11.2 BMP with glucose 462 Creatinine 0.79 01/2015 LFTs wnl TSH 7.820 LDL 88 B12 346  MRI of the brain:  personally reviewed images: essentially normal MRI of the brain, a few foci of t2 hyperintensity may be non-specific chronic microvascular changes    Observations/Objective:  Generalized: Well developed, in no acute distress  Mentation: Alert oriented to time, place, history taking. Follows all commands speech and language fluent   Assessment and Plan:  58 y.o. year old male  has a past medical history of Diabetes mellitus, High cholesterol, Hypertension, Hypothyroidism, and Neuropathy in diabetes ([place]). here with    ICD-10-CM   1. Diabetic polyneuropathy associated with type 2 diabetes mellitus [facility])  E11.42    Mr Mcnamee is doing well. He reports that neuropathy, dizziness and weakness are stable since last visit with Joseph Jaynee Eagles in December 2019. He will continue alpha lipoic acid 642m BID as well as gabapentin 3080min the am and 60084mn the pm. He was advised to work on a healthy lifestyle with low carb diet and regular exercise. He verbalizes understanding and  agreement with plan. He was scheduled for 6 month follow up.   No orders of the defined types were placed in this encounter.   No orders of the defined types were placed in this encounter.    Follow Up Instructions:  I discussed the assessment and treatment plan with the patient. The patient was provided an opportunity to ask questions and all were answered. The patient agreed with the plan and demonstrated an understanding of the instructions.   The patient was advised to call back or seek an in-person evaluation if the symptoms worsen or if the condition fails to improve as anticipated.  I provided 20 minutes of non-face-to-face time during this encounter.  Patient is located at his place of residence during video conference.  Provider is located in the office.  Maryelizabeth Kaufmann, CMA helped to facilitate visit.   Debbora Presto, NP   Made any corrections needed, and agree with history, physical, neuro exam,assessment and plan  as stated.    Sarina Ill, MD

## 2019-01-25 ENCOUNTER — Ambulatory Visit: Payer: BC Managed Care – PPO | Admitting: Family Medicine

## 2019-02-01 ENCOUNTER — Other Ambulatory Visit: Payer: Self-pay

## 2019-02-01 ENCOUNTER — Encounter: Payer: Self-pay | Admitting: Family Medicine

## 2019-02-01 ENCOUNTER — Ambulatory Visit: Payer: BC Managed Care – PPO | Admitting: Family Medicine

## 2019-02-01 VITALS — BP 110/70 | HR 89 | Temp 97.3°F | Ht 69.0 in | Wt 209.5 lb

## 2019-02-01 DIAGNOSIS — G250 Essential tremor: Secondary | ICD-10-CM

## 2019-02-01 DIAGNOSIS — E1142 Type 2 diabetes mellitus with diabetic polyneuropathy: Secondary | ICD-10-CM | POA: Diagnosis not present

## 2019-02-01 NOTE — Patient Instructions (Signed)
We will continue gabapentin 300mg  in am and 600mg  at night. We may consider adding 300mg  gabapentin at lunch if you feel that morning dose helps with tremor.   Follow up in 1 year   Essential Tremor A tremor is trembling or shaking that a person cannot control. Most tremors affect the hands or arms. Tremors can also affect the head, vocal cords, legs, and other parts of the body. Essential tremor is a tremor without a known cause. Usually, it occurs while a person is trying to perform an action. It tends to get worse gradually as a person ages. What are the causes? The cause of this condition is not known. What increases the risk? You are more likely to develop this condition if:  You have a family member with essential tremor.  You are age 59 or older.  You take certain medicines. What are the signs or symptoms? The main sign of a tremor is a rhythmic shaking of certain parts of your body that is uncontrolled and unintentional. You may:  Have difficulty eating with a spoon or fork.  Have difficulty writing.  Nod your head up and down or side to side.  Have a quivering voice. The shaking may:  Get worse over time.  Come and go.  Be more noticeable on one side of your body.  Get worse due to stress, fatigue, caffeine, and extreme heat or cold. How is this diagnosed? This condition may be diagnosed based on:  Your symptoms and medical history.  A physical exam. There is no single test to diagnose an essential tremor. However, your health care provider may order tests to rule out other causes of your condition. These may include:  Blood and urine tests.  Imaging studies of your brain, such as CT scan and MRI.  A test that measures involuntary muscle movement (electromyogram). How is this treated? Treatment for essential tremor depends on the severity of the condition.  Some tremors may go away without treatment.  Mild tremors may not need treatment if they do not  affect your day-to-day life.  Severe tremors may need to be treated using one or more of the following options: ? Medicines. ? Lifestyle changes. ? Occupational or physical therapy. Follow these instructions at home: Lifestyle   Do not use any products that contain nicotine or tobacco, such as cigarettes and e-cigarettes. If you need help quitting, ask your health care provider.  Limit your caffeine intake as told by your health care provider.  Try to get 8 hours of sleep each night.  Find ways to manage your stress that fits your lifestyle and personality. Consider trying meditation or yoga.  Try to anticipate stressful situations and allow extra time to manage them.  If you are struggling emotionally with the effects of your tremor, consider working with a mental health provider. General instructions  Take over-the-counter and prescription medicines only as told by your health care provider.  Avoid extreme heat and extreme cold.  Keep all follow-up visits as told by your health care provider. This is important. Visits may include physical therapy visits. Contact a health care provider if:  You experience any changes in the location or intensity of your tremors.  You start having a tremor after starting a new medicine.  You have tremor with other symptoms, such as: ? Numbness. ? Tingling. ? Pain. ? Weakness.  Your tremor gets worse.  Your tremor interferes with your daily life.  You feel down, blue, or sad for  at least 2 weeks in a row.  Worrying about your tremor and what other people think about you interferes with your everyday life functions, including relationships, work, or school. Summary  Essential tremor is a tremor without a known cause. Usually, it occurs when you are trying to perform an action.  The cause of this condition is not known.  The main sign of a tremor is a rhythmic shaking of certain parts of your body that is uncontrolled and  unintentional.  Treatment for essential tremor depends on the severity of the condition. This information is not intended to replace advice given to you by your health care provider. Make sure you discuss any questions you have with your health care provider. Document Released: 02/16/2014 Document Revised: 02/05/2017 Document Reviewed: 02/05/2017 Elsevier Patient Education  [place].    Diabetic Neuropathy Diabetic neuropathy refers to nerve damage that is caused by diabetes (diabetes mellitus). Over time, people with diabetes can develop nerve damage throughout the body. There are several types of diabetic neuropathy:  Peripheral neuropathy. This is the most common type of diabetic neuropathy. It causes damage to nerves that carry signals between the spinal cord and other parts of the body (peripheral nerves). This usually affects nerves in the feet and legs first, and may eventually affect the hands and arms. The damage affects the ability to sense touch or temperature.  Autonomic neuropathy. This type causes damage to nerves that control involuntary functions (autonomic nerves). These nerves carry signals that control: ? Heartbeat. ? Body temperature. ? Blood pressure. ? Urination. ? Digestion. ? Sweating. ? Sexual function. ? Response to changing blood sugar (glucose) levels.  Focal neuropathy. This type of nerve damage affects one area of the body, such as an arm, a leg, or the face. The injury may involve one nerve or a small group of nerves. Focal neuropathy can be painful and unpredictable, and occurs most often in older adults with diabetes. This often develops suddenly, but usually improves over time and does not cause long-term problems.  Proximal neuropathy. This type of nerve damage affects the nerves of the thighs, hips, buttocks, or legs. It causes severe pain, weakness, and muscle death (atrophy), usually in the thigh muscles. It is more common among older men and  people who have type 2 diabetes. The length of recovery time may vary. What are the causes? Peripheral, autonomic, and focal neuropathies are caused by diabetes that is not well controlled with treatment. The cause of proximal neuropathy is not known, but it may be caused by inflammation related to uncontrolled blood glucose levels. What are the signs or symptoms? Peripheral neuropathy Peripheral neuropathy develops slowly over time. When the nerves of the feet and legs no longer work, you may experience:  Burning, stabbing, or aching pain in the legs or feet.  Pain or cramping in the legs or feet.  Loss of feeling (numbness) and inability to feel pressure or pain in the feet. This can lead to: ? Thick calluses or sores on areas of constant pressure. ? Ulcers. ? Reduced ability to feel temperature changes.  Foot deformities.  Muscle weakness.  Loss of balance or coordination. Autonomic neuropathy The symptoms of autonomic neuropathy vary depending on which nerves are affected. Symptoms may include:  Problems with digestion, such as: ? Nausea or vomiting. ? Poor appetite. ? Bloating. ? Diarrhea or constipation. ? Trouble swallowing. ? Losing weight without trying to.  Problems with the heart, blood and lungs, such as: ? Dizziness,  especially when standing up. ? Fainting. ? Shortness of breath. ? Irregular heartbeat.  Bladder problems, such as: ? Trouble starting or stopping urination. ? Leaking urine. ? Trouble emptying the bladder. ? Urinary tract infections (UTIs).  Problems with other body functions, such as: ? Sweat. You may sweat too much or too little. ? Temperature. You might get hot easily. Or, you might feel cold more than usual. ? Sexual function. Men may not be able to get or maintain an erection. Women may have vaginal dryness and difficulty with arousal. Focal neuropathy Symptoms affect only one area of the body. Common symptoms include:  Numbness.   Tingling.  Burning pain.  Prickling feeling.  Very sensitive skin.  Weakness.  Inability to move (paralysis).  Muscle twitching.  Muscles getting smaller (wasting).  Poor coordination.  Double or blurred vision. Proximal neuropathy  Sudden, severe pain in the hip, thigh, or buttocks. Pain may spread from the back into the legs (sciatica).  Pain and numbness in the arms and legs.  Tingling.  Loss of bladder control or bowel control.  Weakness and wasting of thigh muscles.  Difficulty getting up from a seated position.  Abdominal swelling.  Unexplained weight loss. How is this diagnosed? Diagnosis usually involves reviewing your medical history and any symptoms you have. Diagnosis varies depending on the type of neuropathy your health care provider suspects. Peripheral neuropathy Your health care provider will check areas that are affected by your nervous system (neurologic exam), such as your reflexes, how you move, and what you can feel. You may have other tests, such as:  Blood tests.  Removal and examination of fluid that surrounds the spinal cord (lumbar puncture).  CT scan.  MRI.  A test to check the nerves that control muscles (electromyogram, EMG).  Tests of how quickly messages pass through your nerves (nerve conduction velocity tests).  Removal of a small piece of nerve to be examined under a microscope (biopsy). Autonomic neuropathy You may have tests, such as:  Tests to measure your blood pressure and heart rate. This may include monitoring you while you are safely secured to an exam table that moves you from a lying position to an upright position (table tilt test).  Breathing tests to check your lungs.  Tests to check how food moves through the digestive system (gastric emptying tests).  Blood, sweat, or urine tests.  Ultrasound of your bladder.  Spinal fluid tests. Focal neuropathy This condition may be diagnosed with:  A neurologic  exam.  CT scan.  MRI.  EMG.  Nerve conduction velocity tests. Proximal neuropathy There is no test to diagnose this type of neuropathy. You may have tests to rule out other possible causes of this type of neuropathy. Tests may include:  X-rays of your spine and lumbar region.  Lumbar puncture.  MRI. How is this treated? The goal of treatment is to keep nerve damage from getting worse. The most important part of treatment is keeping your blood glucose level and your A1C level within your target range by following your diabetes management plan. Over time, maintaining lower blood glucose levels helps lessen symptoms. In some cases, you may need prescription pain medicine. Follow these instructions at home:  Lifestyle   Do not use any products that contain nicotine or tobacco, such as cigarettes and e-cigarettes. If you need help quitting, ask your health care provider.  Be physically active every day. Include strength training and balance exercises.  Follow a healthy meal plan.  Work  with your health care provider to manage your blood pressure. General instructions  Follow your diabetes management plan as directed. ? Check your blood glucose levels as directed by your health care provider. ? Keep your blood glucose in your target range as directed by your health care provider. ? Have your A1C level checked at least two times a year, or as often as told by your health care provider.  Take over the counter and prescription medicines only as told by your health care provider. This includes insulin and diabetes medicine.  Do not drive or use heavy machinery while taking prescription pain medicines.  Check your skin and feet every day for cuts, bruises, redness, blisters, or sores.  Keep all follow up visits as told by your health care provider. This is important. Contact a health care provider if:  You have burning, stabbing, or aching pain in your legs or feet.  You are  unable to feel pressure or pain in your feet.  You develop problems with digestion, such as: ? Nausea. ? Vomiting. ? Bloating. ? Constipation. ? Diarrhea. ? Abdominal pain.  You have difficulty with urination, such as inability: ? To control when you urinate (incontinence). ? To completely empty the bladder (retention).  You have palpitations.  You feel dizzy, weak, or faint when you stand up. Get help right away if:  You cannot urinate.  You have sudden weakness or loss of coordination.  You have trouble speaking.  You have pain or pressure in your chest.  You have an irregular heart beat.  You have sudden inability to move a part of your body. Summary  Diabetic neuropathy refers to nerve damage that is caused by diabetes. It can affect nerves throughout the entire body, causing numbness and pain in the arms, legs, digestive tract, heart, and other body systems.  Keep your blood glucose level and your blood pressure in your target range, as directed by your health care provider. This can help prevent neuropathy from getting worse.  Check your skin and feet every day for cuts, bruises, redness, blisters, or sores.  Do not use any products that contain nicotine or tobacco, such as cigarettes and e-cigarettes. If you need help quitting, ask your health care provider. This information is not intended to replace advice given to you by your health care provider. Make sure you discuss any questions you have with your health care provider. Document Released: 04/06/2001 Document Revised: 03/10/2017 Document Reviewed: 03/02/2016 Elsevier Patient Education  2020 [employer].

## 2019-02-01 NOTE — Progress Notes (Signed)
PATIENT: Joseph Chavez DOB: [birth year]  REASON FOR VISIT: follow up HISTORY FROM: patient  Chief Complaint  Patient presents with  . Follow-up    RM 1, alone. Last seen 07/26/18. Reports no new sx. Feels tremors have worsened some. Wants to talk about going back on medication for this.     HISTORY OF PRESENT ILLNESS: Today 02/01/19 Joseph Chavez is a 58 y.o. male here today for follow up for diabetic neuropathy and tremor. He is taking gabapentin 342m in am and 6036min pm. He also takes alpha lipoic acid twice daily as well. He doesn't notice how much it helps until he misses a dose. Tremor is worse in the mornings when he is holding his coffee cup. He also has difficulty writing, specifically if he is anxious. He was previously taking propranolol for essential tremor but felt that it made him sleepy. He was working 70-80 hours a week at that time. He is on lisinopril 2012maily. BP is usually 110/70. A1C 11 at last visit.    HISTORY: (copied from my note on 07/26/2018)  Joseph Chavez a 58 3o. male here today for follow up for diabetic neuropathy.  He reports that since his last visit with Dr. AheLavell Anchors has been fairly stable.  He did start out for left Perlick acid for neuropathy.  He feels that if he takes this consistently it does help.  He continues gabapentin 300 mg in the morning and 600 mg in the evenings.  This also helps with the tingling sensation.  He states that his dizziness is much improved.  He has had 1 small episode of dizziness since his last visit.  This resolved spontaneously.  He states that he continues to have mild weakness of his left upper extremity.  He does feel that this is fairly stable and has not worsened.  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  He admits to about a 6 pound weight gain.  He does not routinely check blood sugars at home.  He thinks his last A1c was around 8.   History (copied from Dr AheCathren Lainete on  01/24/2018)  Interval history 01/24/2018: Patient is here for follow up. He has seen me before in 2017 for multiple issues including ataxia/poor balance, distal sensory loss in the setting of uncontrolled diabetes, left-sided subjective weakness, falls, he had stopped taking all his medications except neurontin at that time and hgba1c was > 11. . MMarland KitchenI of the Braina nd MRA of the head in 12/2017 were unremarkable. In the past I had recommended an MRi of the cervical spine and emg/ncs which patient did not complete. Labworkincluded Lyme, vitamin B1, multiple myeloma panel, vitamin B6, heavy metals, rheumatoid factor, hep C, RPR, ANA with reflex all of which were unremarkable. Patient had uncontrolled diabetes last hemoglobin A1c when I initially saw him was greater than 11. PMHx of uncontrolled DM, hypothyroidism, HLD, HTN. He deniesNo significant headaches, diplopia, aphasia, focal weakness, facial droop.He endorsedgetting choked on swallowing sometimes with saliva or liquidsand tremors.   Today he is here with his wife. He says it was a wake-up call for him. He had acute onset dizziness. He has lost weight. He is stopping soda. Last HgbA1c was 11.2 right before in November. His feet hurt. He has balance issues. He was spinning, worse when turned his head. He was vomiting. He went to the ED. His blood pressure was elevated as well. He  was not taking medications for blood pressure. The dizziness improved with epley maneuver. Meclizine and zofran helped and he doesn't need that again. He had another episode.  He was seen in the emergency room December 13, 2017. Reviewed notes. He had acute onset dizziness and imbalance. He also had associated emesis with his dizziness. He also complained of some tingling sensation to his left hand as well as left face. Exam did show some left-sided weakness with grip and dorsal plantar flexion. Otherwise exam was normal with normal reflexes in the biceps and  patellar. He had some ataxia with left upper extremity finger-to-nose and with left lower extremity heel-to-shin and unsteady gait. MRI was negative. Diagnosed with peripheral vertigo. Glucose was 293.   [facility] which included [facility] being [facility] at [facility] emergency room on December 13, 2017 for dizziness and vomiting. At the time his blood pressure and glucose were quite elevated. He was given meclizine and Zofran from the emergency room. He was referred back to me his blood sugar is still uncontrolled.  FXO:VANVBT B Foxis a 58 y.o.malehere as a referral from Dr. Dyane Dustman multiple issues. PMHx of uncontrolled DM, hypothyroidism, HLD, His wife is here with him. He has been falling. He has poor balance. Memory has been funny. Symptoms started over the summer. 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 He just went down, he tripped over his feet. He has uncontrolled diabetes. He stopped taking his medication and his last HgbA1c is 11.2. Takes neurontin for neuropathy. 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 The pain is severe, tingling, burning, numbness. He has cramping in the feet. The symptoms are continuous and progressing and slowly worsening over the years. He is hypothyroid and his TSH is elevated. No significant headaches, diplopia, aphasia, focal weakness, facial droop. He has had uncontrolled glucose for 5 or more years. He says he has speaking problems, his speech "catches". He has fatigue. He reports getting choked on swallowing sometimes with saliva or liquids. He has some occ.neck pain and LBP but no radicular symptoms. Father with diabetic neuropathy. He reports tremors, he shakes with his hands a lot of the time per wife. Propranolol may hellp, he is unsure. His handwriting is poor. No vision changes. Denies smoking, denies current alcohol intake. In the past more social  drinking, <1 a day. He had part of his colon removed due to polyps. He is slow walker, has to look at his feet to see what is under his feet.   Reviewed notes, labs and imaging from outside physicians, which showed:  HgbA1c 11.2 BMP with glucose 462 Creatinine 0.79 01/2015 LFTs wnl TSH 7.820 LDL 88 B12 346  MRI of the brain: personally reviewed images: essentially normal MRI of the brain, a few foci of t2 hyperintensity may be non-specific chronic microvascular changes   REVIEW OF SYSTEMS: Out of a complete 14 system review of symptoms, the patient complains only of the following symptoms, tremor, numbness and all other reviewed systems are negative.  ALLERGIES: No Known Allergies  HOME MEDICATIONS: Outpatient Medications Prior to Visit  Medication Sig Dispense Refill  . B-D UF III MINI PEN NEEDLES 31G X 5 MM MISC use at bedtime 100 each 5  . Blood Glucose Monitoring Suppl (ACCU-CHEK AVIVA) device Use as instructed 1 each 0  . dapagliflozin propanediol (FARXIGA) 10 MG TABS tablet Take 10 mg by mouth  daily.    . Dulaglutide (TRULICITY [place]) Inject 1 pen into the skin once a week.    . gabapentin (NEURONTIN) 300 MG capsule Take by mouth. One in the morning and two at bedtime    . gemfibrozil (LOPID) 600 MG tablet Take 1 tablet (600 mg total) by mouth 2 (two) times daily before a meal. 60 tablet 2  . glucose blood (ACCU-CHEK AVIVA) test strip Use to test glucose 4 times a day 150 each 3  . Insulin Pen Needle (PEN NEEDLES) 31G X 6 MM MISC 1 each by Does not apply route at bedtime. 100 each 5  . LEVEMIR FLEXTOUCH 100 UNIT/ML Pen Inject 50 Units into the skin daily. evening  5  . levothyroxine (SYNTHROID, LEVOTHROID) 175 MCG tablet Take 1 tablet (175 mcg total) by mouth daily before breakfast. 30 tablet 3  . lisinopril (PRINIVIL,ZESTRIL) 20 MG tablet Take 20 mg by mouth daily.    . meclizine (ANTIVERT) 12.5 MG tablet Take 2 tablets (25 mg total) by mouth 3 (three) times daily as needed  for dizziness. 60 tablet 0  . Multiple Vitamins-Minerals (CENTRUM SILVER 50+MEN) TABS Take 1 tablet by mouth daily.    . Omega-3 Fatty Acids (FISH OIL) 1200 MG CAPS Take by mouth.    [name] omeprazole (PRILOSEC) 20 MG capsule Take 20 mg by mouth as needed (heartburn).     . ondansetron (ZOFRAN ODT) 4 MG disintegrating tablet Take 1 tablet (4 mg total) by mouth every 8 (eight) hours as needed for nausea or vomiting. 20 tablet 0  . simvastatin (ZOCOR) 20 MG tablet Take 20 mg by mouth at bedtime.     No facility-administered medications prior to visit.    PAST MEDICAL HISTORY: Past Medical History:  Diagnosis Date  . Diabetes mellitus   . High cholesterol   . Hypertension   . Hypothyroidism   . Neuropathy in diabetes [facility])     PAST SURGICAL HISTORY: Past Surgical History:  Procedure Laterality Date  . ABSCESS DRAINAGE    . COLECTOMY  2016   partial  . HERNIA REPAIR  5638   Umbilical  . SHOULDER SURGERY Left 2016  . TONSILLECTOMY  2000  . VASECTOMY      FAMILY HISTORY: Family History  Problem Relation Age of Onset  . Hypertension Mother   . Hypertension Father   . Diabetes Father   . CAD Father   . Neuropathy Father   . Congestive Heart Failure Father   . Melanoma Paternal Grandmother   . Cancer Maternal Grandmother     SOCIAL HISTORY: Social History   Socioeconomic History  . Marital status: Married    Spouse name: Seth Bake  . Number of children: 4  . Years of education: 12+, has all hours for masters  . Highest education level: Bachelor's degree (e.g., BA, AB, BS)  Occupational History  . Not on file  Tobacco Use  . Smoking status: Never Smoker  . Smokeless tobacco: Never Used  Substance and Sexual Activity  . Alcohol use: No    Comment: quit 2012  . Drug use: No  . Sexual activity: Not on file  Other Topics Concern  . Not on file  Social History Narrative   Lives with wife and son   Caffeine use:  20 oz drinks per day   Right handed   Social  Determinants of Health   Financial Resource Strain:   . Difficulty of Paying Living Expenses: Not on file  Food Insecurity:   .  Worried About Charity fundraiser in the Last Year: Not on file  . Ran Out of Food in the Last Year: Not on file  Transportation Needs:   . Lack of Transportation (Medical): Not on file  . Lack of Transportation (Non-Medical): Not on file  Physical Activity:   . Days of Exercise per Week: Not on file  . Minutes of Exercise per Session: Not on file  Stress:   . Feeling of Stress : Not on file  Social Connections:   . Frequency of Communication with Friends and Family: Not on file  . Frequency of Social Gatherings with Friends and Family: Not on file  . Attends Religious Services: Not on file  . Active Member of Clubs or Organizations: Not on file  . Attends Archivist Meetings: Not on file  . Marital Status: Not on file  Intimate Partner Violence:   . Fear of Current or Ex-Partner: Not on file  . Emotionally Abused: Not on file  . Physically Abused: Not on file  . Sexually Abused: Not on file      PHYSICAL EXAM  Vitals:   02/01/19 1334  BP: 110/70  Pulse: 89  Temp: (!) 97.3 F (36.3 C)  SpO2: 97%  Weight: 209 lb 8 oz (95 kg)  Height: '5\' 9"'  (1.753 m)   Body mass index is 30.94 kg/m.  Generalized: Well developed, in no acute distress  Neurological examination  Mentation: Alert oriented to time, place, history taking. Follows all commands speech and language fluent Cranial nerve II-XII: Pupils were equal round reactive to light. Extraocular movements were full, visual field were full. Motor: The motor testing reveals 5 over 5 strength of all 4 extremities. Good symmetric motor tone is noted throughout.  Sensory: Sensory testing is intact to soft touch on all 4 extremities. No evidence of extinction is noted. Pinprick testing decreased bilaterally of feet to ankle  Coordination: Cerebellar testing reveals good finger-nose-finger and  heel-to-shin bilaterally.  Gait and station: Gait is normal.   Reflexes: Deep tendon reflexes are symmetric and normal bilaterally.   DIAGNOSTIC DATA (LABS, IMAGING, TESTING) - I reviewed patient records, labs, notes, testing and imaging myself where available.  No flowsheet data found.   Lab Results  Component Value Date   WBC 6.5 12/13/2017   HGB 16.3 12/13/2017   HCT 49.7 12/13/2017   MCV 87.8 12/13/2017   PLT 162 12/13/2017      Component Value Date/Time   PROT 8.1 02/25/2015 1105   No results found for: CHOL, HDL, LDLCALC, LDLDIRECT, TRIG, CHOLHDL No results found for: HGBA1C No results found for: VITAMINB12 No results found for: TSH   ASSESSMENT AND PLAN 58 y.o. year old male  has a past medical history of Diabetes mellitus, High cholesterol, Hypertension, Hypothyroidism, and Neuropathy in diabetes ([place]). here with     ICD-10-CM   1. Diabetic polyneuropathy associated with type 2 diabetes mellitus (HCC)  E11.42   2. Essential tremor  G25.0     Mr. Joseph Chavez is doing well overall.  Gabapentin 300 mg in the a.m. and 600 mg in the p.m. is helping to control neuropathy pain.  He has noted slight worsening of essential tremor.  He has taken propranolol in the past but felt that it made him tired.  His blood pressure typically runs on the lower side.  We have discussed restarting propranolol versus starting primidone.  Mr. Ignasiak is hesitant at this time due to side effects.  We have discussed  increasing gabapentin dose as well.  We will continue current treatment plan for now.  We will consider adding an extra 300 mg dose of gabapentin at lunch if he feels that morning dose is helping with tremor.  He will continue alpha lipoic acid as well.  We have discussed need for good diabetic control.  He will continue close follow-up with primary care.  He will follow-up in 1 year, sooner if needed.  He verbalizes understanding and agreement with this plan.   No orders of the defined types were  placed in this encounter.    No orders of the defined types were placed in this encounter.     Debbora Presto, FNP-C 02/01/2019, 2:25 PM [facility] [address], [place], [place] [address] [phone]

## 2019-02-01 NOTE — Progress Notes (Signed)
I have read the note, and I agree with the clinical assessment and plan.  Jayvier Burgher K Jahzier Villalon   

## 2019-04-08 ENCOUNTER — Ambulatory Visit: Payer: BC Managed Care – PPO | Attending: Internal Medicine

## 2019-04-08 DIAGNOSIS — Z23 Encounter for immunization: Secondary | ICD-10-CM | POA: Insufficient documentation

## 2019-04-08 NOTE — Progress Notes (Signed)
   Covid-19 Vaccination Clinic  Name:  Joseph Chavez    MRN: [id number] DOB: 08-Mar-1960  04/08/2019  Mr. Joseph Chavez was observed post Covid-19 immunization for 15 minutes without incidence. He was provided with Vaccine Information Sheet and instruction to access the V-Safe system.   Mr. Joseph Chavez was instructed to call 911 with any severe reactions post vaccine: [name] Difficulty breathing  . Swelling of your face and throat  . A fast heartbeat  . A bad rash all over your body  . Dizziness and weakness    Immunizations Administered    Name Date Dose VIS Date Route   Pfizer COVID-19 Vaccine 04/08/2019 12:00 PM 0.3 mL 01/20/2019 Intramuscular   Manufacturer: [employer], [employer]   Lot: [id number]   NDC: [address]-0

## 2019-04-29 ENCOUNTER — Ambulatory Visit: Payer: BC Managed Care – PPO | Attending: Internal Medicine

## 2019-04-29 DIAGNOSIS — Z23 Encounter for immunization: Secondary | ICD-10-CM

## 2019-04-29 NOTE — Progress Notes (Signed)
   Covid-19 Vaccination Clinic  Name:  Joseph Chavez    MRN: [id number] DOB: 01/26/1961  04/29/2019  Mr. [name] was observed post Covid-19 immunization for 15 minutes without incident. He was provided with Vaccine Information Sheet and instruction to access the V-Safe system.   Mr. [name] was instructed to call 911 with any severe reactions post vaccine: [name] Difficulty breathing  . Swelling of face and throat  . A fast heartbeat  . A bad rash all over body  . Dizziness and weakness   Immunizations Administered    Name Date Dose VIS Date Route   Pfizer COVID-19 Vaccine 04/29/2019  8:59 AM 0.3 mL 01/20/2019 Intramuscular   Manufacturer: [employer], [employer]   Lot: [id number]   NDC: [address]-6

## 2019-05-03 ENCOUNTER — Ambulatory Visit: Payer: BC Managed Care – PPO

## 2019-07-20 ENCOUNTER — Telehealth: Payer: Self-pay | Admitting: Family Medicine

## 2019-07-20 ENCOUNTER — Other Ambulatory Visit: Payer: Self-pay | Admitting: Family Medicine

## 2019-07-20 MED ORDER — GABAPENTIN 300 MG PO CAPS: ORAL_CAPSULE | ORAL | 3 refills | Status: AC

## 2019-07-20 NOTE — Telephone Encounter (Signed)
This was done.

## 2019-07-20 NOTE — Telephone Encounter (Signed)
Patient is requesting refill of Gabapentin

## 2019-07-20 NOTE — Telephone Encounter (Signed)
Relayed to pt.  He appreciated call back.

## 2019-08-31 ENCOUNTER — Ambulatory Visit: Payer: BC Managed Care – PPO | Admitting: "Endocrinology

## 2019-10-17 ENCOUNTER — Ambulatory Visit: Payer: BC Managed Care – PPO | Admitting: "Endocrinology

## 2019-11-14 ENCOUNTER — Ambulatory Visit: Payer: BC Managed Care – PPO | Admitting: "Endocrinology

## 2019-11-15 LAB — BASIC METABOLIC PANEL
BUN: 22 — AB (ref 4–21)
Creatinine: 1.1 (ref 0.6–1.3)

## 2019-11-15 LAB — LIPID PANEL
Cholesterol: 188 (ref 0–200)
HDL: 31 — AB (ref 35–70)
LDL Cholesterol: 91
Triglycerides: 400 — AB (ref 40–160)

## 2019-11-15 LAB — TSH: TSH: 0.29 — AB (ref 0.41–5.90)

## 2019-11-15 LAB — HEMOGLOBIN A1C: Hemoglobin A1C: 12.1

## 2019-11-16 LAB — COMPREHENSIVE METABOLIC PANEL
GFR calc Af Amer: 83
GFR calc non Af Amer: 72

## 2019-12-06 ENCOUNTER — Other Ambulatory Visit: Payer: Self-pay

## 2019-12-06 ENCOUNTER — Ambulatory Visit: Payer: BC Managed Care – PPO | Admitting: "Endocrinology

## 2019-12-06 ENCOUNTER — Encounter: Payer: Self-pay | Admitting: "Endocrinology

## 2019-12-06 VITALS — BP 110/64 | HR 84 | Ht 69.0 in | Wt 200.0 lb

## 2019-12-06 DIAGNOSIS — E039 Hypothyroidism, unspecified: Secondary | ICD-10-CM | POA: Diagnosis not present

## 2019-12-06 DIAGNOSIS — E1165 Type 2 diabetes mellitus with hyperglycemia: Secondary | ICD-10-CM

## 2019-12-06 MED ORDER — LEVOTHYROXINE SODIUM 175 MCG PO TABS
175.0000 ug | ORAL_TABLET | Freq: Every day | ORAL | 3 refills | Status: DC
Start: 2019-12-06 — End: 2020-04-02

## 2019-12-06 NOTE — Patient Instructions (Signed)

## 2019-12-06 NOTE — Progress Notes (Signed)
Endocrinology Consult Note       12/06/2019, 5:01 PM   Subjective:    Patient ID: Joseph Chavez, male    DOB: 05/08/1960.  Joseph Chavez is being seen in consultation for management of currently uncontrolled symptomatic diabetes requested by  Lianne Moris, PA-C.   Past Medical History:  Diagnosis Date   Diabetes mellitus    Diabetes mellitus, type II (HCC)    High cholesterol    Hypertension    Hypothyroidism    Neuropathy in diabetes [facility])     Past Surgical History:  Procedure Laterality Date   ABSCESS DRAINAGE     COLECTOMY  2016   partial   HERNIA REPAIR  2007   Umbilical   SHOULDER SURGERY Left 2016   TONSILLECTOMY  2000   VASECTOMY      Social History   Socioeconomic History   Marital status: Married    Spouse name: [name]   Number of children: 4   Years of education: 12+, has all hours for masters   Highest education level: Bachelor's degree (e.g., BA, AB, BS)  Occupational History   Not on file  Tobacco Use   Smoking status: Never Smoker   Smokeless tobacco: Never Used  Vaping Use   Vaping Use: Never used  Substance and Sexual Activity   Alcohol use: No    Comment: quit 2012   Drug use: No   Sexual activity: Not on file  Other Topics Concern   Not on file  Social History Narrative   Lives with wife and son   Caffeine use:  20 oz drinks per day   Right handed   Social Determinants of Health   Financial Resource Strain:    Difficulty of Paying Living Expenses: Not on file  Food Insecurity:    Worried About Programme researcher, broadcasting/film/video in the Last Year: Not on file   [employer] of Food in the Last Year: Not on file  Transportation Needs:    Lack of Transportation (Medical): Not on file   Lack of Transportation (Non-Medical): Not on file  Physical Activity:    Days of Exercise per Week: Not on file   Minutes of Exercise per Session: Not on file   Stress:    Feeling of Stress : Not on file  Social Connections:    Frequency of Communication with Friends and Family: Not on file   Frequency of Social Gatherings with Friends and Family: Not on file   Attends Religious Services: Not on file   Active Member of Clubs or Organizations: Not on file   Attends Banker Meetings: Not on file   Marital Status: Not on file    Family History  Problem Relation Age of Onset   Hypertension Mother    Thyroid disease Mother    Hypertension Father    Diabetes Father    CAD Father    Neuropathy Father    Congestive Heart Failure Father    Melanoma Paternal Grandmother    Cancer Maternal Grandmother     Outpatient Encounter Medications as of 12/06/2019  Medication Sig   aspirin EC  81 MG tablet Take 81 mg by mouth daily. Swallow whole.   B-D UF III MINI PEN NEEDLES 31G X 5 MM MISC use at bedtime   Blood Glucose Monitoring Suppl (ACCU-CHEK AVIVA) device Use as instructed   Dulaglutide (TRULICITY Osborne) Inject 1.5 mg into the skin once a week.    gabapentin (NEURONTIN) 300 MG capsule One in the morning and two at bedtime   gemfibrozil (LOPID) 600 MG tablet Take 1 tablet (600 mg total) by mouth 2 (two) times daily before a meal.   glucose blood (ACCU-CHEK AVIVA) test strip Use to test glucose 4 times a day   Insulin Pen Needle (PEN NEEDLES) 31G X 6 MM MISC 1 each by Does not apply route at bedtime.   LEVEMIR FLEXTOUCH 100 UNIT/ML Pen Inject 70 Units into the skin at bedtime. evening   levothyroxine (SYNTHROID) 175 MCG tablet Take 1 tablet (175 mcg total) by mouth daily before breakfast.   lisinopril (PRINIVIL,ZESTRIL) 20 MG tablet Take 20 mg by mouth daily.   Multiple Vitamins-Minerals (CENTRUM SILVER 50+MEN) TABS Take 1 tablet by mouth daily.   Omega-3 Fatty Acids (FISH OIL) 1200 MG CAPS Take by mouth.   ondansetron (ZOFRAN ODT) 4 MG disintegrating tablet Take 1 tablet (4 mg total) by mouth every 8 (eight)  hours as needed for nausea or vomiting.   sertraline (ZOLOFT) 25 MG tablet Take 25 mg by mouth at bedtime.   simvastatin (ZOCOR) 20 MG tablet Take 20 mg by mouth at bedtime.   [DISCONTINUED] dapagliflozin propanediol (FARXIGA) 10 MG TABS tablet Take 10 mg by mouth daily.   [DISCONTINUED] levothyroxine (SYNTHROID) 200 MCG tablet Take 200 mcg by mouth daily.   [DISCONTINUED] levothyroxine (SYNTHROID, LEVOTHROID) 175 MCG tablet Take 1 tablet (175 mcg total) by mouth daily before breakfast.   [DISCONTINUED] meclizine (ANTIVERT) 12.5 MG tablet Take 2 tablets (25 mg total) by mouth 3 (three) times daily as needed for dizziness.   [DISCONTINUED] omeprazole (PRILOSEC) 20 MG capsule Take 20 mg by mouth as needed (heartburn).    No facility-administered encounter medications on file as of 12/06/2019.    ALLERGIES: No Known Allergies  VACCINATION STATUS: Immunization History  Administered Date(s) Administered   PFIZER SARS-COV-2 Vaccination 04/08/2019, 04/29/2019    Diabetes He presents for his initial diabetic visit. He has type 2 diabetes mellitus. Onset time: He was diagnosed at approximate age of 59 years. His disease course has been worsening (He was seen in 2017 and failed to return for follow-up visit.). There are no hypoglycemic associated symptoms. Pertinent negatives for hypoglycemia include no confusion, headaches, pallor or seizures. Associated symptoms include blurred vision, polydipsia and polyuria. Pertinent negatives for diabetes include no chest pain, no fatigue, no polyphagia and no weakness. There are no hypoglycemic complications. Symptoms are worsening. Diabetic complications include retinopathy. Risk factors for coronary artery disease include diabetes mellitus, dyslipidemia, family history, male sex and hypertension. Current diabetic treatments: He is currently on Levemir 60 units nightly, Trulicity 1.5 mg weekly, and Farxiga 10 mg p.o. daily. His weight is fluctuating  minimally. He is following a generally unhealthy diet. When asked about meal planning, he reported none. He has not had a previous visit with a dietitian. He participates in exercise intermittently. (This patient was seen until 2017, failed to return for a visit.  He comes in with no meter nor logs to review.  His recent A1c was 12.1%.  He denies any hypoglycemia recently.) An ACE inhibitor/angiotensin II receptor blocker is being taken. Eye exam  is current.  Thyroid Problem Presents for initial visit. Patient reports no constipation, diarrhea, fatigue or palpitations. The symptoms have been worsening. Past treatments include levothyroxine. His past medical history is significant for hyperlipidemia. Risk factors include family history of hypothyroidism.  Hyperlipidemia This is a chronic problem. The current episode started more than 1 year ago. The problem is uncontrolled. Pertinent negatives include no chest pain, myalgias or shortness of breath. Current antihyperlipidemic treatment includes statins and fibric acid derivatives. Risk factors for coronary artery disease include family history, dyslipidemia, diabetes mellitus, hypertension, male sex and obesity.  Hypertension The current episode started more than 1 year ago. Associated symptoms include blurred vision. Pertinent negatives include no chest pain, headaches, neck pain, palpitations or shortness of breath. Risk factors for coronary artery disease include dyslipidemia, diabetes mellitus, male gender and family history. Past treatments include ACE inhibitors. Hypertensive end-organ damage includes retinopathy. Identifiable causes of hypertension include a thyroid problem.     Review of Systems  Constitutional: Negative for chills, fatigue, fever and unexpected weight change.  HENT: Negative for dental problem, mouth sores and trouble swallowing.   Eyes: Positive for blurred vision. Negative for visual disturbance.  Respiratory: Negative for  cough, choking, chest tightness, shortness of breath and wheezing.   Cardiovascular: Negative for chest pain, palpitations and leg swelling.  Gastrointestinal: Negative for abdominal distention, abdominal pain, constipation, diarrhea, nausea and vomiting.  Endocrine: Positive for polydipsia and polyuria. Negative for polyphagia.  Genitourinary: Negative for dysuria, flank pain, hematuria and urgency.  Musculoskeletal: Negative for back pain, gait problem, myalgias and neck pain.  Skin: Negative for pallor, rash and wound.  Neurological: Negative for seizures, syncope, weakness, numbness and headaches.  Psychiatric/Behavioral: Negative.  Negative for confusion and dysphoric mood.    Objective:    Vitals with BMI 12/06/2019 02/01/2019 01/24/2018  Height 5\' 9"  5\' 9"  5\' 9"   Weight 200 lbs 209 lbs 8 oz 203 lbs  BMI 29.52 30.92 29.96  Systolic 110 110  Diastolic 64 70 70  Pulse 84 89 100    BP 110/64    Pulse 84    Ht 5\' 9"  (1.753 m)    Wt 200 lb (90.7 kg)    BMI 29.53 kg/m   Wt Readings from Last 3 Encounters:  12/06/19 200 lb (90.7 kg)  02/01/19 209 lb 8 oz (95 kg)  01/24/18 203 lb (92.1 kg)     Physical Exam Constitutional:      General: He is not in acute distress.    Appearance: He is well-developed.  HENT:     Head: Normocephalic and atraumatic.  Neck:     Thyroid: No thyromegaly.     Trachea: No tracheal deviation.  Cardiovascular:     Rate and Rhythm: Normal rate.     Pulses:          Dorsalis pedis pulses are 1+ on the right side and 1+ on the left side.       Posterior tibial pulses are 1+ on the right side and 1+ on the left side.     Heart sounds: S1 normal and S2 normal. No murmur heard.  No gallop.   Pulmonary:     Effort: Pulmonary effort is normal. No respiratory distress.     Breath sounds: No wheezing.  Abdominal:     General: There is no distension.     Tenderness: There is no abdominal tenderness. There is no guarding.  Musculoskeletal:     Right  shoulder: No swelling  or deformity.     Cervical back: Normal range of motion and neck supple.  Skin:    General: Skin is warm and dry.     Findings: No rash.     Nails: There is no clubbing.  Neurological:     Mental Status: He is alert and oriented to person, place, and time.     Cranial Nerves: No cranial nerve deficit.     Sensory: No sensory deficit.     Gait: Gait normal.     Deep Tendon Reflexes: Reflexes are normal and symmetric.  Psychiatric:        Speech: Speech normal.        Behavior: Behavior normal. Behavior is cooperative.        Thought Content: Thought content normal.        Judgment: Judgment normal.       CMP ( most recent) CMP     Component Value Date/Time   BUN 22 (A) 11/15/2019 0000   CREATININE 1.1 11/15/2019 0000   PROT 8.1 02/25/2015 1105   GFRNONAA 72 11/15/2019 0000   GFRAA 83 11/15/2019 0000     Diabetic Labs (most recent): Lab Results  Component Value Date   HGBA1C 12.1 11/15/2019     Lipid Panel ( most recent) Lipid Panel     Component Value Date/Time   CHOL 188 11/15/2019 0000   TRIG 400 (A) 11/15/2019 0000   HDL 31 (A) 11/15/2019 0000   LDLCALC 91 11/15/2019 0000      Lab Results  Component Value Date   TSH 0.29 (A) 11/15/2019      Assessment & Plan:   1. Uncontrolled type 2 diabetes mellitus with hyperglycemia (HCC)  - Joseph Chavez has currently uncontrolled symptomatic type 2 DM since  59 years of age,  with most recent A1c of 12.1 %. Recent labs reviewed. - I had a long discussion with him about the progressive nature of diabetes and the pathology behind its complications. -his diabetes is complicated by retinopathy and he remains at a high risk for more acute and chronic complications which include CAD, CVA, CKD, retinopathy, and neuropathy. These are all discussed in detail with him.  - I have counseled him on diet  and weight management  by adopting a carbohydrate restricted/protein rich diet. Patient is  encouraged to switch to  unprocessed or minimally processed   complex starch and increased protein intake (animal or plant source), fruits, and vegetables. -  he is advised to stick to a routine mealtimes to eat 3 meals  a day and avoid unnecessary snacks ( to snack only to correct hypoglycemia).   - he admits that there is a room for improvement in his food and drink choices. - Suggestion is made for him to avoid simple carbohydrates  from his diet including Cakes, Sweet Desserts, Ice Cream, Soda (diet and regular), Sweet Tea, Candies, Chips, Cookies, Store Bought Juices, Alcohol in Excess of  1-2 drinks a day, Artificial Sweeteners,  Coffee Creamer, and "Sugar-free" Products. This will help patient to have more stable blood glucose profile and potentially avoid unintended weight gain.  - he will be scheduled with Joseph Chavez, RDN, Joseph Chavez for diabetes education.  - I have approached him with the following individualized plan to manage  his diabetes and patient agrees:   -In light of his presentation with A1c greater than 12% on 2 separate occasions, this patient may need intensive treatment with basal/bolus insulin in order for him to achieve  and maintain control of diabetes to target. -In preparation, he is advised to increase his Levemir to 70 units nightly, initiate strict monitoring of blood glucose 4 times a day-before meals and at bedtime, and return in 7 days for reevaluation. -He will be considered for prandial insulin if he presents with significantly above target postprandial glycemia. - he is warned not to take insulin without proper monitoring per orders.  - he is encouraged to call clinic for blood glucose levels less than 70 or above 200 mg /dl. - he is advised to continue Trulicity 1.5 mg subcutaneously weekly, therapeutically suitable for patient . -His Marcelline Deist will be discontinued, due to the fact that he is unreliable for follow-up.  He reports intolerance to Metformin. -  Specific targets for  A1c;  LDL, HDL,  and Triglycerides were discussed with the patient.  2) Blood Pressure /Hypertension:  his blood pressure is  controlled to target.   he is advised to continue his current medications including lisinopril 20 mg p.o. daily with breakfast . 3) Lipids/Hyperlipidemia:   Review of his recent lipid panel showed un controlled  LDL at 91 .  he  is advised to continue simvastatin 20 mg p.o. nightly, gemfibrozil 600 mg p.o. twice daily.  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  He will be considered for Lovaza or Vascepa on subsequent visits.   4)  Weight/Diet:  Body mass index is 29.53 kg/m.  -   clearly complicating his diabetes care.   he is  a candidate for weight loss. I discussed with him the fact that loss of 5 - 10% of his  current body weight will have the most impact on his diabetes management.  Exercise, and detailed carbohydrates information provided  -  detailed on discharge instructions.   5) hypothyroidism-longstanding diagnosis During his last visit with me, his levothyroxine was adjusted at 175 mcg.  In the interim it was increased to 200 mcg daily.  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  I discussed and lowered his levothyroxine to 175 mcg p.o. daily before breakfast.   - We discussed about the correct intake of his thyroid hormone, on empty stomach at fasting, with water, separated by at least 30 minutes from breakfast and other medications,  and separated by more than 4 hours from calcium, iron, multivitamins, acid reflux medications (PPIs). -Patient is made aware of the fact that thyroid hormone replacement is needed for life, dose to be adjusted by periodic monitoring of thyroid function tests.   6) Chronic Care/Health Maintenance:  -he  is on ACEI/ARB and Statin medications and  is encouraged to initiate and continue to follow up with Ophthalmology, Dentist,   Podiatrist at least yearly or according to recommendations, and advised to   stay away from smoking. I have recommended yearly flu vaccine and pneumonia vaccine at least every 5 years; moderate intensity exercise for up to 150 minutes weekly; and  sleep for at least 7 hours a day.  - he is  advised to maintain close follow up with Lianne Moris, PA-C for primary care needs, as well as his other providers for optimal and coordinated care.   - Time spent in this patient care: 80 min, of which > 50% was spent in  counseling  him about his chronically uncontrolled, complicated type 2 diabetes, hypothyroidism, hyperlipidemia, hypertension and the rest reviewing his blood glucose logs , discussing his hypoglycemia and hyperglycemia episodes, reviewing his current  and  previous labs / studies  ( including abstraction from other facilities) and medications  doses and developing a  long term treatment plan based on the latest standards of care/ guidelines; and documenting his care.    Please refer to Patient Instructions for Blood Glucose Monitoring and Insulin/Medications Dosing Guide"  in media tab for additional information. Please  also refer to " Patient Self Inventory" in the Media  tab for reviewed elements of pertinent patient history.  Joseph Chavez participated in the discussions, expressed understanding, and voiced agreement with the above plans.  All questions were answered to his satisfaction. he is encouraged to contact clinic should he have any questions or concerns prior to his return visit.   Follow up plan: - Return in about 1 week (around 12/13/2019) for F/U with Meter and Logs Only - no Labs.  Marquis Lunch, MD [facility] [address] [place], [place] [address] Phone: [phone]  Fax: [phone]    12/06/2019, 5:01 PM  This note was partially dictated with voice recognition software. Similar sounding words can be transcribed  inadequately or may not  be corrected upon review.

## 2019-12-13 ENCOUNTER — Ambulatory Visit: Payer: BC Managed Care – PPO | Admitting: "Endocrinology

## 2019-12-13 ENCOUNTER — Other Ambulatory Visit: Payer: Self-pay

## 2019-12-13 ENCOUNTER — Encounter: Payer: Self-pay | Admitting: "Endocrinology

## 2019-12-13 VITALS — BP 102/68 | HR 84 | Ht 69.0 in | Wt 199.0 lb

## 2019-12-13 DIAGNOSIS — E1165 Type 2 diabetes mellitus with hyperglycemia: Secondary | ICD-10-CM | POA: Diagnosis not present

## 2019-12-13 DIAGNOSIS — E039 Hypothyroidism, unspecified: Secondary | ICD-10-CM | POA: Diagnosis not present

## 2019-12-13 MED ORDER — TRESIBA FLEXTOUCH 200 UNIT/ML ~~LOC~~ SOPN: 70.0000 [IU] | PEN_INJECTOR | Freq: Every day | SUBCUTANEOUS | 2 refills | Status: DC

## 2019-12-13 NOTE — Patient Instructions (Signed)

## 2019-12-13 NOTE — Progress Notes (Signed)
12/13/2019, 8:36 PM  Endocrinology follow-up note   Subjective:    Patient ID: Joseph Chavez, male    DOB: 1960/09/09.  Joseph Chavez is being seen in follow-up after he was seen in consultation for management of currently uncontrolled symptomatic diabetes requested by  Lianne Moris, PA-C.   Past Medical History:  Diagnosis Date  . Diabetes mellitus   . Diabetes mellitus, type II (HCC)   . High cholesterol   . Hypertension   . Hypothyroidism   . Neuropathy in diabetes [facility])     Past Surgical History:  Procedure Laterality Date  . ABSCESS DRAINAGE    . COLECTOMY  2016   partial  . HERNIA REPAIR  2007   Umbilical  . SHOULDER SURGERY Left 2016  . TONSILLECTOMY  2000  . VASECTOMY      Social History   Socioeconomic History  . Marital status: Married    Spouse name: [name]  . Number of children: 4  . Years of education: 12+, has all hours for masters  . Highest education level: Bachelor's degree (e.g., BA, AB, BS)  Occupational History  . Not on file  Tobacco Use  . Smoking status: Never Smoker  . Smokeless tobacco: Never Used  Vaping Use  . Vaping Use: Never used  Substance and Sexual Activity  . Alcohol use: No    Comment: quit 2012  . Drug use: No  . Sexual activity: Not on file  Other Topics Concern  . Not on file  Social History Narrative   Lives with wife and son   Caffeine use:  20 oz drinks per day   Right handed   Social Determinants of Health   Financial Resource Strain:   . Difficulty of Paying Living Expenses: Not on file  Food Insecurity:   . Worried About Programme researcher, broadcasting/film/video in the Last Year: Not on file  . Ran Out of Food in the Last Year: Not on file  Transportation Needs:   . Lack of Transportation (Medical): Not on file  . Lack of Transportation (Non-Medical): Not on file  Physical Activity:   . Days of Exercise per Week: Not on file  . Minutes of  Exercise per Session: Not on file  Stress:   . Feeling of Stress : Not on file  Social Connections:   . Frequency of Communication with Friends and Family: Not on file  . Frequency of Social Gatherings with Friends and Family: Not on file  . Attends Religious Services: Not on file  . Active Member of Clubs or Organizations: Not on file  . Attends Banker Meetings: Not on file  . Marital Status: Not on file    Family History  Problem Relation Age of Onset  . Hypertension Mother   . Thyroid disease Mother   . Hypertension Father   . Diabetes Father   . CAD Father   . Neuropathy Father   . Congestive Heart Failure Father   . Melanoma Paternal Grandmother   . Cancer Maternal Grandmother     Outpatient Encounter Medications as of 12/13/2019  Medication Sig  .  aspirin EC 81 MG tablet Take 81 mg by mouth daily. Swallow whole.  . B-D UF III MINI PEN NEEDLES 31G X 5 MM MISC use at bedtime  . Blood Glucose Monitoring Suppl (ACCU-CHEK AVIVA) device Use as instructed  . Dulaglutide (TRULICITY [place]) Inject 1.5 mg into the skin once a week.   . gabapentin (NEURONTIN) 300 MG capsule One in the morning and two at bedtime  . gemfibrozil (LOPID) 600 MG tablet Take 1 tablet (600 mg total) by mouth 2 (two) times daily before a meal.  . glucose blood (ACCU-CHEK AVIVA) test strip Use to test glucose 4 times a day  . insulin degludec (TRESIBA FLEXTOUCH) 200 UNIT/ML FlexTouch Pen Inject 70 Units into the skin at bedtime.  . Insulin Pen Needle (PEN NEEDLES) 31G X 6 MM MISC 1 each by Does not apply route at bedtime.  [name] levothyroxine (SYNTHROID) 175 MCG tablet Take 1 tablet (175 mcg total) by mouth daily before breakfast.  . lisinopril (PRINIVIL,ZESTRIL) 20 MG tablet Take 20 mg by mouth daily.  . Multiple Vitamins-Minerals (CENTRUM SILVER 50+MEN) TABS Take 1 tablet by mouth daily.  . Omega-3 Fatty Acids (FISH OIL) 1200 MG CAPS Take by mouth.  . ondansetron (ZOFRAN ODT) 4 MG disintegrating  tablet Take 1 tablet (4 mg total) by mouth every 8 (eight) hours as needed for nausea or vomiting.  . sertraline (ZOLOFT) 25 MG tablet Take 25 mg by mouth at bedtime.  . simvastatin (ZOCOR) 20 MG tablet Take 20 mg by mouth at bedtime.  . [DISCONTINUED] LEVEMIR FLEXTOUCH 100 UNIT/ML Pen Inject 70 Units into the skin at bedtime. evening   No facility-administered encounter medications on file as of 12/13/2019.    ALLERGIES: No Known Allergies  VACCINATION STATUS: Immunization History  Administered Date(s) Administered  . PFIZER SARS-COV-2 Vaccination 04/08/2019, 04/29/2019    Diabetes He presents for his follow-up diabetic visit. He has type 2 diabetes mellitus. Onset time: He was diagnosed at approximate age of 59 years. His disease course has been improving (He was seen in 2017 and failed to return for follow-up visit.). There are no hypoglycemic associated symptoms. Pertinent negatives for hypoglycemia include no confusion, headaches, pallor or seizures. Associated symptoms include blurred vision. Pertinent negatives for diabetes include no chest pain, no fatigue, no polydipsia, no polyphagia, no polyuria and no weakness. There are no hypoglycemic complications. Symptoms are improving. Diabetic complications include retinopathy. Risk factors for coronary artery disease include diabetes mellitus, dyslipidemia, family history, male sex and hypertension. Current diabetic treatments: He is currently on Levemir 60 units nightly, Trulicity 1.5 mg weekly, and Farxiga 10 mg p.o. daily. He is compliant with treatment most of the time. His weight is fluctuating minimally. He is following a generally unhealthy diet. When asked about meal planning, he reported none. He has not had a previous visit with a dietitian. He participates in exercise intermittently. His home blood glucose trend is decreasing steadily. His breakfast blood glucose range is generally 110-130 mg/dl. His lunch blood glucose range is  generally 110-130 mg/dl. His dinner blood glucose range is generally 130-140 mg/dl. His bedtime blood glucose range is generally 130-140 mg/dl. His overall blood glucose range is 130-140 mg/dl. (Patient presents with significant improvement in his glycemic profile, to near target levels.  No hypoglycemia.  His recent A1c was 12.1%.    ) An ACE inhibitor/angiotensin II receptor blocker is being taken. Eye exam is current.  Thyroid Problem Presents for initial visit. Patient reports no constipation, diarrhea, fatigue or palpitations.  The symptoms have been worsening. Past treatments include levothyroxine. His past medical history is significant for hyperlipidemia. Risk factors include family history of hypothyroidism.  Hyperlipidemia This is a chronic problem. The current episode started more than 1 year ago. The problem is uncontrolled. Pertinent negatives include no chest pain, myalgias or shortness of breath. Current antihyperlipidemic treatment includes statins and fibric acid derivatives. Risk factors for coronary artery disease include family history, dyslipidemia, diabetes mellitus, hypertension, male sex and obesity.  Hypertension The current episode started more than 1 year ago. Associated symptoms include blurred vision. Pertinent negatives include no chest pain, headaches, neck pain, palpitations or shortness of breath. Risk factors for coronary artery disease include dyslipidemia, diabetes mellitus, male gender and family history. Past treatments include ACE inhibitors. Hypertensive end-organ damage includes retinopathy. Identifiable causes of hypertension include a thyroid problem.     Review of Systems  Constitutional: Negative for chills, fatigue, fever and unexpected weight change.  HENT: Negative for dental problem, mouth sores and trouble swallowing.   Eyes: Positive for blurred vision. Negative for visual disturbance.  Respiratory: Negative for cough, choking, chest tightness,  shortness of breath and wheezing.   Cardiovascular: Negative for chest pain, palpitations and leg swelling.  Gastrointestinal: Negative for abdominal distention, abdominal pain, constipation, diarrhea, nausea and vomiting.  Endocrine: Negative for polydipsia, polyphagia and polyuria.  Genitourinary: Negative for dysuria, flank pain, hematuria and urgency.  Musculoskeletal: Negative for back pain, gait problem, myalgias and neck pain.  Skin: Negative for pallor, rash and wound.  Neurological: Negative for seizures, syncope, weakness, numbness and headaches.  Psychiatric/Behavioral: Negative.  Negative for confusion and dysphoric mood.    Objective:    Vitals with BMI 12/13/2019 12/06/2019 02/01/2019  Height 5\' 9"  5\' 9"  5\' 9"   Weight 199 lbs 200 lbs 209 lbs 8 oz  BMI 29.37 29.52 30.92  Systolic 102 110 161110  Diastolic 68 64 70  Pulse 84 84 89    BP 102/68   Pulse 84   Ht 5\' 9"  (1.753 m)   Wt 199 lb (90.3 kg)   BMI 29.39 kg/m   Wt Readings from Last 3 Encounters:  12/13/19 199 lb (90.3 kg)  12/06/19 200 lb (90.7 kg)  02/01/19 209 lb 8 oz (95 kg)     Physical Exam Constitutional:      General: He is not in acute distress.    Appearance: He is well-developed.  HENT:     Head: Normocephalic and atraumatic.  Neck:     Thyroid: No thyromegaly.     Trachea: No tracheal deviation.  Cardiovascular:     Rate and Rhythm: Normal rate.     Pulses:          Dorsalis pedis pulses are 1+ on the right side and 1+ on the left side.       Posterior tibial pulses are 1+ on the right side and 1+ on the left side.     Heart sounds: S1 normal and S2 normal. No murmur heard.  No gallop.   Pulmonary:     Effort: Pulmonary effort is normal. No respiratory distress.     Breath sounds: No wheezing.  Abdominal:     General: There is no distension.     Tenderness: There is no abdominal tenderness. There is no guarding.  Musculoskeletal:     Right shoulder: No swelling or deformity.      Cervical back: Normal range of motion and neck supple.  Skin:    General: Skin is  warm and dry.     Findings: No rash.     Nails: There is no clubbing.  Neurological:     Mental Status: He is alert and oriented to person, place, and time.     Cranial Nerves: No cranial nerve deficit.     Sensory: No sensory deficit.     Gait: Gait normal.     Deep Tendon Reflexes: Reflexes are normal and symmetric.  Psychiatric:        Speech: Speech normal.        Behavior: Behavior normal. Behavior is cooperative.        Thought Content: Thought content normal.        Judgment: Judgment normal.     CMP ( most recent) CMP     Component Value Date/Time   BUN 22 (A) 11/15/2019 0000   CREATININE 1.1 11/15/2019 0000   PROT 8.1 02/25/2015 1105   GFRNONAA 72 11/15/2019 0000   GFRAA 83 11/15/2019 0000     Diabetic Labs (most recent): Lab Results  Component Value Date   HGBA1C 12.1 11/15/2019     Lipid Panel ( most recent) Lipid Panel     Component Value Date/Time   CHOL 188 11/15/2019 0000   TRIG 400 (A) 11/15/2019 0000   HDL 31 (A) 11/15/2019 0000   LDLCALC 91 11/15/2019 0000      Lab Results  Component Value Date   TSH 0.29 (A) 11/15/2019      Assessment & Plan:   1. Uncontrolled type 2 diabetes mellitus with hyperglycemia (HCC)  - Renardo B Vana has currently uncontrolled symptomatic type 2 DM since  59 years of age.  Patient presents with significant improvement in his glycemic profile, to near target levels.  No hypoglycemia.  His recent A1c was 12.1%.  Recent labs reviewed. - I had a long discussion with him about the progressive nature of diabetes and the pathology behind its complications. -his diabetes is complicated by retinopathy and he remains at a high risk for more acute and chronic complications which include CAD, CVA, CKD, retinopathy, and neuropathy. These are all discussed in detail with him.  - I have counseled him on diet  and weight management  by adopting  a carbohydrate restricted/protein rich diet. Patient is encouraged to switch to  unprocessed or minimally processed   complex starch and increased protein intake (animal or plant source), fruits, and vegetables. -  he is advised to stick to a routine mealtimes to eat 3 meals  a day and avoid unnecessary snacks ( to snack only to correct hypoglycemia).   - he  admits there is a room for improvement in his diet and drink choices. -  Suggestion is made for him to avoid simple carbohydrates  from his diet including Cakes, Sweet Desserts / Pastries, Ice Cream, Soda (diet and regular), Sweet Tea, Candies, Chips, Cookies, Sweet Pastries,  Store Bought Juices, Alcohol in Excess of  1-2 drinks a day, Artificial Sweeteners, Coffee Creamer, and "Sugar-free" Products. This will help patient to have stable blood glucose profile and potentially avoid unintended weight gain.  - he will be scheduled with Norm Salt, RDN, CDE for diabetes education.  - I have approached him with the following individualized plan to manage  his diabetes and patient agrees:   -In light of his presentation with near target glycemic profile despite his recent A1c of 12%, he will not need prandial insulin for now.    He responded to the basal insulin very  well.  He will be considered for [place] instead of his Levemir.  Advised to finish Levemir and switch to [place] 70 units nightly, associated with monitoring of blood glucose twice a day daily before breakfast and at bedtime.    - he is warned not to take insulin without proper monitoring per orders.  - he is encouraged to call clinic for blood glucose levels less than 70 or above 200 mg /dl. - he is advised to continue Trulicity 1.5 mg subcutaneously weekly, therapeutically suitable for patient . -He reports intolerance to Metformin.   - Specific targets for  A1c;  LDL, HDL,  and Triglycerides were discussed with the patient.  2) Blood Pressure /Hypertension: His blood  pressure is controlled to target.   he is advised to continue his current medications including lisinopril 20 mg p.o. daily with breakfast . 3) Lipids/Hyperlipidemia:   Review of his recent lipid panel showed un controlled  LDL at 91 .  he  is advised to continue simvastatin 20 mg p.o. nightly.  , gemfibrozil 600 mg p.o. twice daily.  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  He will be considered for Lovaza or Vascepa on subsequent visits.   4)  Weight/Diet:  Body mass index is 29.39 kg/m.  -   clearly complicating his diabetes care.   he is  a candidate for weight loss. I discussed with him the fact that loss of 5 - 10% of his  current body weight will have the most impact on his diabetes management.  Exercise, and detailed carbohydrates information provided  -  detailed on discharge instructions.   5) hypothyroidism-longstanding diagnosis During his last visit with me, his levothyroxine was adjusted at 175 mcg.  In the interim it was increased to 200 mcg daily.  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  I discussed and lowered his levothyroxine to 175 mcg p.o. daily before breakfast.   - We discussed about the correct intake of his thyroid hormone, on empty stomach at fasting, with water, separated by at least 30 minutes from breakfast and other medications,  and separated by more than 4 hours from calcium, iron, multivitamins, acid reflux medications (PPIs). -Patient is made aware of the fact that thyroid hormone replacement is needed for life, dose to be adjusted by periodic monitoring of thyroid function tests.    6) Chronic Care/Health Maintenance:  -he  is on ACEI/ARB and Statin medications and  is encouraged to initiate and continue to follow up with Ophthalmology, Dentist,  Podiatrist at least yearly or according to recommendations, and advised to   stay away from smoking. I have recommended yearly flu  vaccine and pneumonia vaccine at least every 5 years; moderate intensity exercise for up to 150 minutes weekly; and  sleep for at least 7 hours a day.  - he is  advised to maintain close follow up with Lianne Moris, PA-C for primary care needs, as well as his other providers for optimal and coordinated care.  - Time spent on this patient care encounter:  35 min, of which > 50% was spent in  counseling and the rest reviewing his blood glucose logs , discussing his hypoglycemia and hyperglycemia episodes, reviewing his current and  previous labs / studies  ( including abstraction from other facilities) and medications  doses and developing a  long term treatment plan and documenting his care.   Please refer to Patient Instructions for Blood Glucose Monitoring  and Insulin/Medications Dosing Guide"  in media tab for additional information. Please  also refer to " Patient Self Inventory" in the Media  tab for reviewed elements of pertinent patient history.  Bharat B Lofland participated in the discussions, expressed understanding, and voiced agreement with the above plans.  All questions were answered to his satisfaction. he is encouraged to contact clinic should he have any questions or concerns prior to his return visit.  Follow up plan: - Return in about 3 months (around 03/14/2020) for F/U with Pre-visit Labs, Urine MA - NV, ABI in Office NV.  Marquis Lunch, MD [facility] [address] [place], [place] [address] Phone: [phone]  Fax: [phone]    12/13/2019, 8:36 PM  This note was partially dictated with voice recognition software. Similar sounding words can be transcribed inadequately or may not  be corrected upon review.

## 2020-02-01 ENCOUNTER — Ambulatory Visit: Payer: BC Managed Care – PPO | Admitting: Family Medicine

## 2020-02-08 ENCOUNTER — Ambulatory Visit: Payer: BC Managed Care – PPO | Admitting: Family Medicine

## 2020-03-14 ENCOUNTER — Ambulatory Visit: Payer: BC Managed Care – PPO | Admitting: "Endocrinology

## 2020-04-02 ENCOUNTER — Other Ambulatory Visit: Payer: Self-pay | Admitting: "Endocrinology

## 2020-04-04 ENCOUNTER — Ambulatory Visit: Payer: Self-pay | Admitting: "Endocrinology

## 2020-04-08 IMAGING — MR MR CERVICAL SPINE W/O CM
4 of 5 series · 14 of 48 positions shown · non-contrast
Comparison: None.

CLINICAL DATA: Neck pain, radiates down the left arm for 3 years

EXAM:
MRI CERVICAL SPINE WITHOUT CONTRAST
TECHNIQUE: Multiplanar, multisequence MR imaging of the cervical spine was
performed. No intravenous contrast was administered.

[Series 3: T2 · sagittal · 3.0mm · 0.36mm/px · 5 of 13 slices shown (1 of 2)]
[im 1/13]
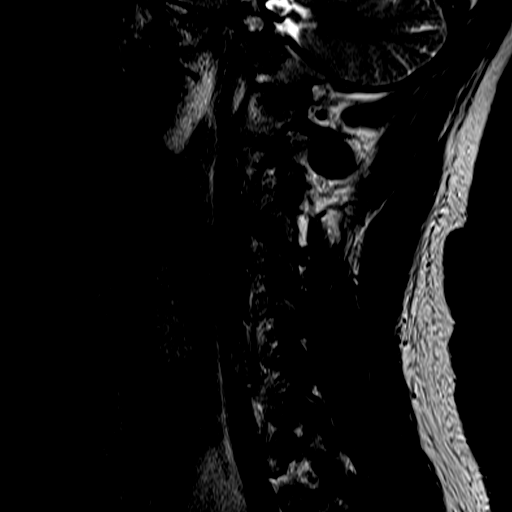
[im 3/13]
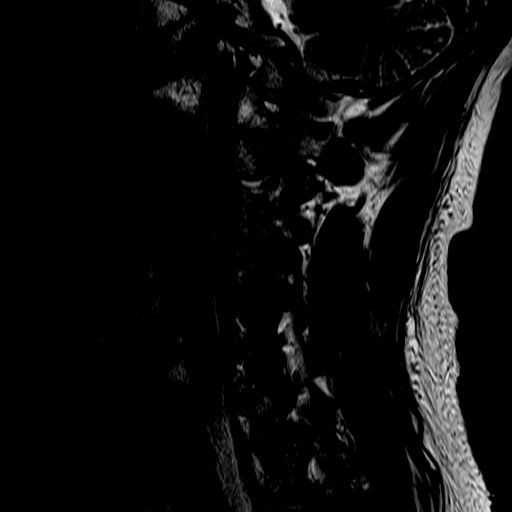
[im 5/13]
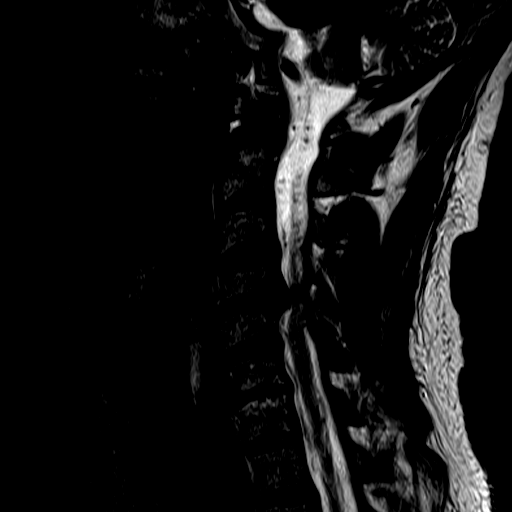
[im 8/13]
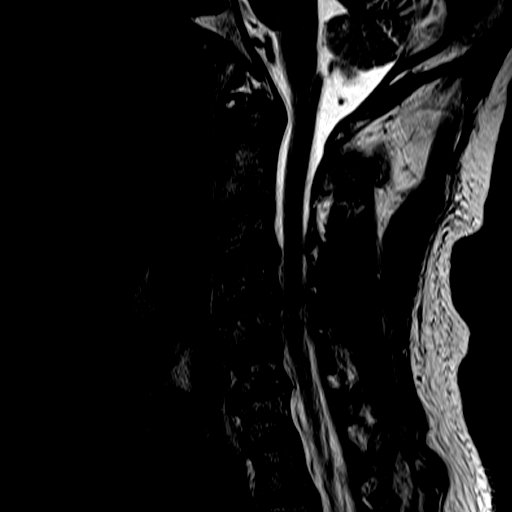
[im 13/13]
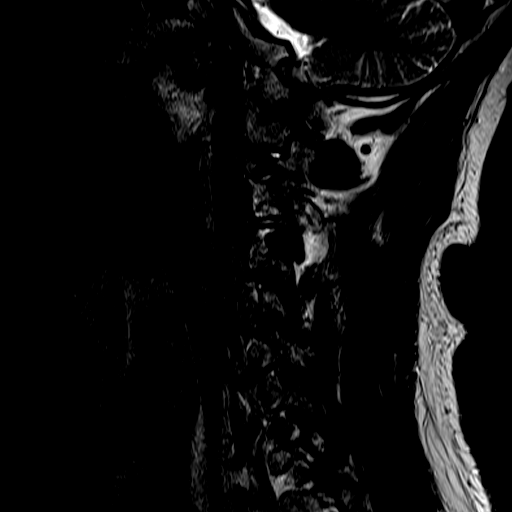

[Series 4: FLAIR · sagittal · 3.0mm · 0.41mm/px · 3 of 13 slices shown]
[im 3/13]
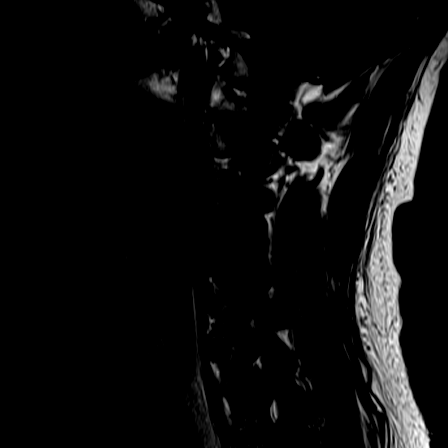
[im 8/13]
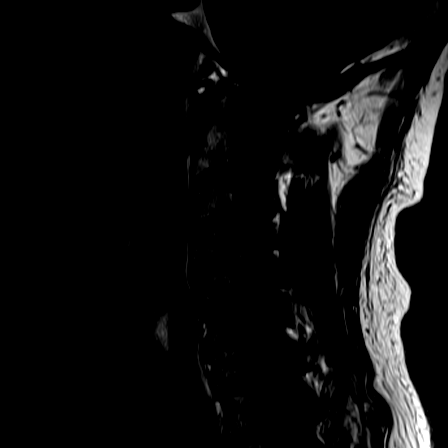
[im 13/13]
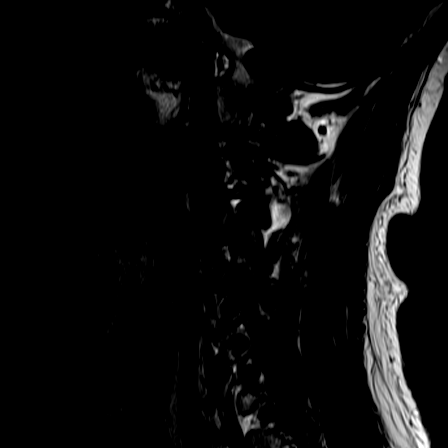

[Series 5: ir sagital · sagittal · 3.0mm · 0.21mm/px · 3 of 13 slices shown]
[im 3/13]
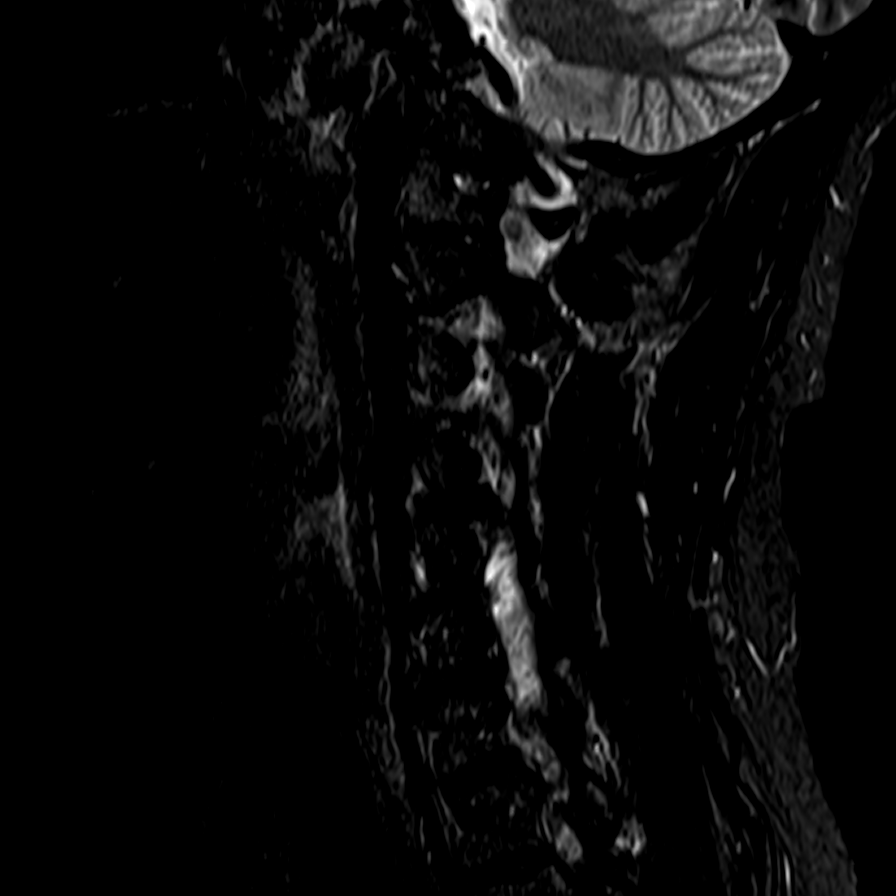
[im 8/13]
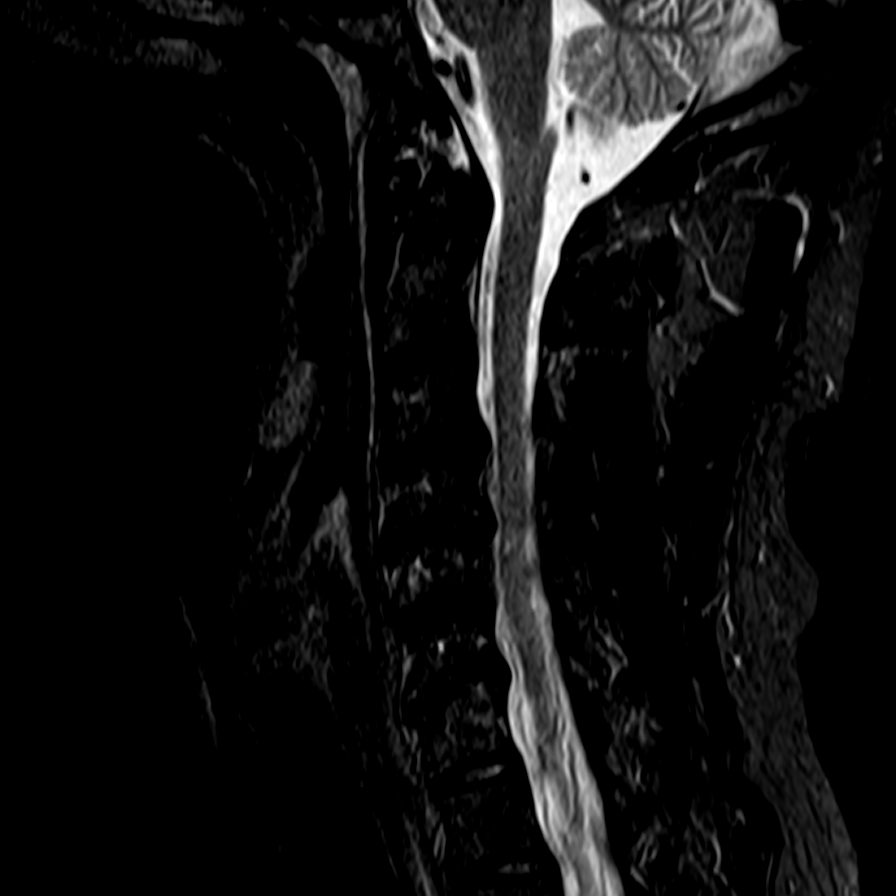
[im 13/13]
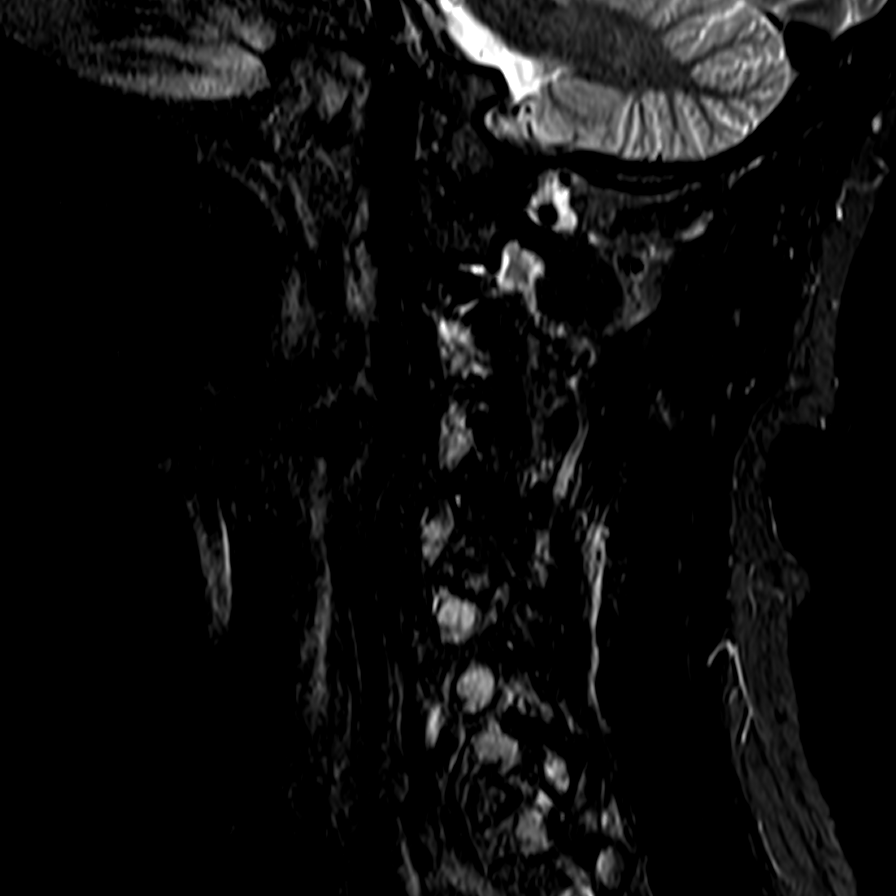

[Series 7: T2 · axial · 3.0mm · 0.22mm/px · z∈[-65,+5]mm · 3 of 32 slices shown (2 of 2)]
[im 5/32]
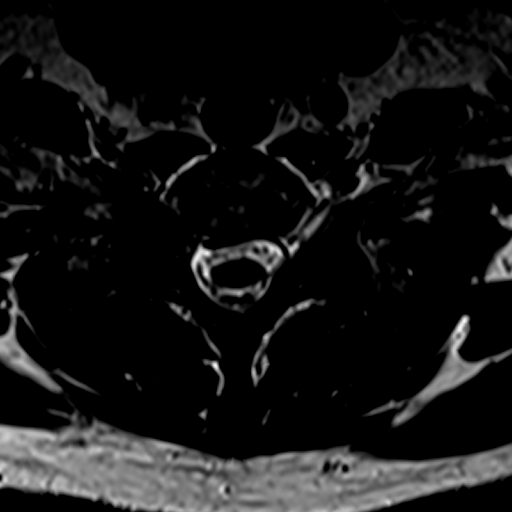
[im 16/32]
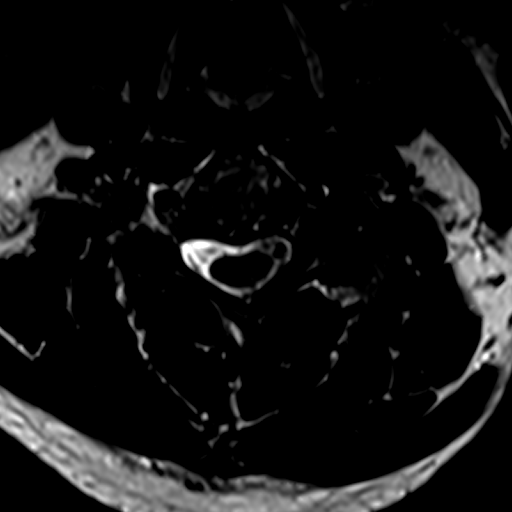
[im 27/32]
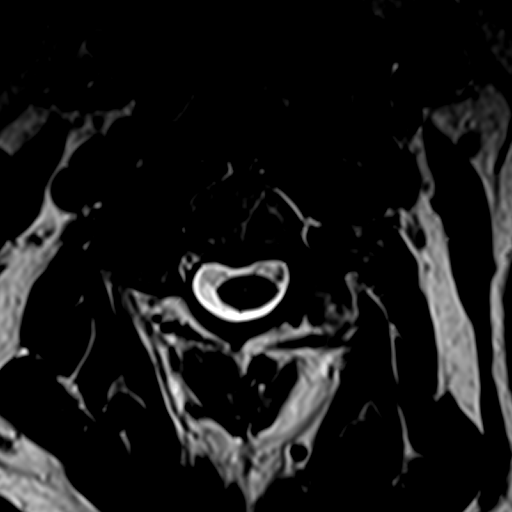

[14 of 48 positions shown; findings below may reference images not displayed]

FINDINGS: Alignment: Physiologic.

Vertebrae: No fracture, evidence of discitis, or bone lesion.

Cord: Normal signal and morphology.

Posterior Fossa, vertebral arteries, paraspinal tissues: Posterior
fossa demonstrates no focal abnormality. Vertebral artery flow voids
are maintained. Paraspinal soft tissues are unremarkable.

Disc levels:

Discs: Degenerative disc disease with disc height loss at C4-5 and
C6-7.

C2-3: No significant disc bulge. No neural foraminal stenosis. No
central canal stenosis.

C3-4: Mild broad-based disc bulge with a small central disc
protrusion. Right uncovertebral degenerative changes. Moderate right
foraminal stenosis. No central canal stenosis.

C4-5: Broad right paracentral disc protrusion deforming the right
paracentral ventral spinal cord. Severe right foraminal stenosis.
Severe left foraminal stenosis. Mild spinal stenosis.

C5-6: Minimal broad-based disc bulge. Moderate left facet
arthropathy. No neural foraminal stenosis. No central canal
stenosis.

C6-7: Mild broad-based disc bulge. no neural foraminal stenosis. No
central canal stenosis.

C7-T1: Mild broad-based disc bulge. Mild left foraminal stenosis. No
right foraminal stenosis. No neural foraminal stenosis. No central
canal stenosis.

T1-2: Moderate bilateral facet arthropathy.
IMPRESSION: 1. Cervical spine spondylosis as described above.

## 2020-05-07 ENCOUNTER — Ambulatory Visit: Payer: BC Managed Care – PPO | Admitting: Family Medicine

## 2020-05-07 ENCOUNTER — Other Ambulatory Visit: Payer: Self-pay

## 2020-05-07 ENCOUNTER — Encounter: Payer: Self-pay | Admitting: Family Medicine

## 2020-05-07 VITALS — BP 139/81 | HR 82 | Ht 70.0 in | Wt 213.0 lb

## 2020-05-07 DIAGNOSIS — G250 Essential tremor: Secondary | ICD-10-CM | POA: Diagnosis not present

## 2020-05-07 DIAGNOSIS — E1142 Type 2 diabetes mellitus with diabetic polyneuropathy: Secondary | ICD-10-CM

## 2020-05-07 MED ORDER — PROPRANOLOL HCL 20 MG PO TABS: 20.0000 mg | ORAL_TABLET | Freq: Two times a day (BID) | ORAL | 3 refills | Status: AC

## 2020-05-07 NOTE — Progress Notes (Addendum)
PATIENT: Joseph Chavez DOB: 1960-07-15  REASON FOR VISIT: follow up HISTORY FROM: patient  Chief Complaint  Patient presents with  . Follow-up    Rm 2 alone Pt is well, things are about the same. Still has some pain in feet and Slight tremors in hands. [facility] to [facility] in [facility] few weeks to see how blood work is going.      HISTORY OF PRESENT ILLNESS: 05/07/20 ALL:  Mr Zeringue returns for follow up for diabetic neuropathy and tremor. He continues gabapentin 300/600. Neuropathy pain is mild. Tremor is a little worse. Activity and stress make it worse. He was started on sertraline 24m daily that helps. He is seeing a cRestaurant manager, fast food Neck pain is better. A1C was 12.1 in 11/2019. He has been working on diet and exercise. He is taking Trulicity. He has appt upcoming with PCP.   02/01/2019 ALL:  Joseph B FMontminyis a 60y.o. Chavez here today for follow up for diabetic neuropathy and tremor. He is taking gabapentin 3034min am and 60026mn pm. He also takes alpha lipoic acid twice daily as well. He doesn't notice how much it helps until he misses a dose. Tremor is worse in the mornings when he is holding his coffee cup. He also has difficulty writing, specifically if he is anxious. He was previously taking propranolol for essential tremor but felt that it made him sleepy. He was working 70-80 hours a week at that time. He is on lisinopril 76m74mily. BP is usually 110/70. A1C 11 at last visit.    HISTORY: (copied from my note on 07/26/2018)  Joseph Chavez 58 y28. Chavez here today for follow up for diabetic neuropathy.  He reports that since his last visit with Dr. AheaLavell Anchorshas been fairly stable.  He did start out for left Perlick acid for neuropathy.  He feels that if he takes this consistently it does help.  He continues gabapentin 300 mg in the morning and 600 mg in the evenings.  This also helps with the tingling sensation.  He states that his dizziness is much improved.  He has had 1 small  episode of dizziness since his last visit.  This resolved spontaneously.  He states that he continues to have mild weakness of his left upper extremity.  He does feel that this is fairly stable and has not worsened.  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  He admits to about a 6 pound weight gain.  He does not routinely check blood sugars at home.  He thinks his last A1c was around 8.   History (copied from Dr AherCathren Lainee on 01/24/2018)  Interval history 01/24/2018: Patient is here for follow up. He has seen me before in 2017 for multiple issues including ataxia/poor balance, distal sensory loss in the setting of uncontrolled diabetes, left-sided subjective weakness, falls, he had stopped taking all his medications except neurontin at that time and hgba1c was > 11. . MRMarland Kitchen of the Braina nd MRA of the head in 12/2017 were unremarkable. In the past I had recommended an MRi of the cervical spine and emg/ncs which patient did not complete. Labworkincluded Lyme, vitamin B1, multiple myeloma panel, vitamin B6, heavy metals, rheumatoid factor, hep C, RPR, ANA with reflex all of which were unremarkable. Patient had uncontrolled diabetes last hemoglobin A1c when I initially saw him was greater than 11. PMHx of uncontrolled DM, hypothyroidism, HLD, HTN.  He deniesNo significant headaches, diplopia, aphasia, focal weakness, facial droop.He endorsedgetting choked on swallowing sometimes with saliva or liquidsand tremors.   Today he is here with his wife. He says it was a wake-up call for him. He had acute onset dizziness. He has lost weight. He is stopping soda. Last HgbA1c was 11.2 right before in November. His feet hurt. He has balance issues. He was spinning, worse when turned his head. He was vomiting. He went to the ED. His blood pressure was elevated as well. He was not taking medications for blood pressure. The dizziness improved with epley maneuver. Meclizine and zofran  helped and he doesn't need that again. He had another episode.  He was seen in the emergency room December 13, 2017. Reviewed notes. He had acute onset dizziness and imbalance. He also had associated emesis with his dizziness. He also complained of some tingling sensation to his left hand as well as left face. Exam did show some left-sided weakness with grip and dorsal plantar flexion. Otherwise exam was normal with normal reflexes in the biceps and patellar. He had some ataxia with left upper extremity finger-to-nose and with left lower extremity heel-to-shin and unsteady gait. MRI was negative. Diagnosed with peripheral vertigo. Glucose was 293.   [facility]ed [facility]'s referral which included patient being seen at [facility] emergency room on December 13, 2017 for dizziness and vomiting. At the time his blood pressure and glucose were quite elevated. He was given meclizine and Zofran from the emergency room. He was referred back to me his blood sugar is still uncontrolled.  Joseph B Foxis a 60 y.o.malehere as a referral from Dr. Dyane Dustman multiple issues. PMHx of uncontrolled DM, hypothyroidism, HLD, His wife is here with him. He has been falling. He has poor balance. Memory has been funny. Symptoms started over the summer. 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 He just went down, he tripped over his feet. He has uncontrolled diabetes. He stopped taking his medication and his last HgbA1c is 11.2. Takes neurontin for neuropathy. 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 The pain is severe, tingling, burning, numbness. He has cramping in the feet. The symptoms are continuous and progressing and slowly worsening over the years. He is hypothyroid and his TSH is elevated. No significant headaches, diplopia, aphasia, focal weakness, facial droop. He has had uncontrolled glucose for 5 or more years. He  says he has speaking problems, his speech "catches". He has fatigue. He reports getting choked on swallowing sometimes with saliva or liquids. He has some occ.neck pain and LBP but no radicular symptoms. Father with diabetic neuropathy. He reports tremors, he shakes with his hands a lot of the time per wife. Propranolol may hellp, he is unsure. His handwriting is poor. No vision changes. Denies smoking, denies current alcohol intake. In the past more social drinking, <1 a day. He had part of his colon removed due to polyps. He is slow walker, has to look at his feet to see what is under his feet.   Reviewed notes, labs and imaging from outside physicians, which showed:  HgbA1c 11.2 BMP with glucose 462 Creatinine 0.79 01/2015 LFTs wnl TSH 7.820 LDL 88 B12 346  MRI of the brain: personally reviewed images: essentially normal MRI of the brain, a few foci of t2 hyperintensity may be non-specific chronic microvascular changes   REVIEW OF SYSTEMS: Out of  a complete 14 system review of symptoms, the patient complains only of the following symptoms, tremor, numbness and all other reviewed systems are negative.  ALLERGIES: No Known Allergies  HOME MEDICATIONS: Outpatient Medications Prior to Visit  Medication Sig Dispense Refill  . aspirin EC 81 MG tablet Take 81 mg by mouth daily. Swallow whole.    . B-D UF III MINI PEN NEEDLES 31G X 5 MM MISC use at bedtime 100 each 5  . Blood Glucose Monitoring Suppl (ACCU-CHEK AVIVA) device Use as instructed 1 each 0  . Dulaglutide (TRULICITY [place]) Inject 1.5 mg into the skin once a week.     . gabapentin (NEURONTIN) 300 MG capsule One in the morning and two at bedtime 270 capsule 3  . gemfibrozil (LOPID) 600 MG tablet Take 1 tablet (600 mg total) by mouth 2 (two) times daily before a meal. 60 tablet 2  . glucose blood (ACCU-CHEK AVIVA) test strip Use to test glucose 4 times a day 150 each 3  . insulin degludec (TRESIBA FLEXTOUCH) 200 UNIT/ML FlexTouch  Pen Inject 70 Units into the skin at bedtime. 12 mL 2  . Insulin Pen Needle (PEN NEEDLES) 31G X 6 MM MISC 1 each by Does not apply route at bedtime. 100 each 5  . levothyroxine (SYNTHROID) 175 MCG tablet TAKE 1 TABLET BY MOUTH EVERY DAY BEFORE BREAKFAST 30 tablet 3  . lisinopril (PRINIVIL,ZESTRIL) 20 MG tablet Take 20 mg by mouth daily.    . Multiple Vitamins-Minerals (CENTRUM SILVER 50+MEN) TABS Take 1 tablet by mouth daily.    . Omega-3 Fatty Acids (FISH OIL) 1200 MG CAPS Take by mouth.    . sertraline (ZOLOFT) 25 MG tablet Take 25 mg by mouth at bedtime.    . simvastatin (ZOCOR) 20 MG tablet Take 20 mg by mouth at bedtime.    . ondansetron (ZOFRAN ODT) 4 MG disintegrating tablet Take 1 tablet (4 mg total) by mouth every 8 (eight) hours as needed for nausea or vomiting. (Patient not taking: Reported on 05/07/2020) 20 tablet 0   No facility-administered medications prior to visit.    PAST MEDICAL HISTORY: Past Medical History:  Diagnosis Date  . Diabetes mellitus   . Diabetes mellitus, type II ([place])   . High cholesterol   . Hypertension   . Hypothyroidism   . Neuropathy in diabetes [facility])     PAST SURGICAL HISTORY: Past Surgical History:  Procedure Laterality Date  . ABSCESS DRAINAGE    . COLECTOMY  2016   partial  . HERNIA REPAIR  8144   Umbilical  . SHOULDER SURGERY Left 2016  . TONSILLECTOMY  2000  . VASECTOMY      FAMILY HISTORY: Family History  Problem Relation Age of Onset  . Hypertension Mother   . Thyroid disease Mother   . Hypertension Father   . Diabetes Father   . CAD Father   . Neuropathy Father   . Congestive Heart Failure Father   . Melanoma Paternal Grandmother   . Cancer Maternal Grandmother     SOCIAL HISTORY: Social History   Socioeconomic History  . Marital status: Married    Spouse name: Seth Bake  . Number of children: 4  . Years of education: 12+, has all hours for masters  . Highest education level: Bachelor's degree (e.g., BA, AB, BS)   Occupational History  . Not on file  Tobacco Use  . Smoking status: Never Smoker  . Smokeless tobacco: Never Used  Vaping Use  . Vaping Use:  Never used  Substance and Sexual Activity  . Alcohol use: No    Comment: quit 2012  . Drug use: No  . Sexual activity: Not on file  Other Topics Concern  . Not on file  Social History Narrative   Lives with wife and son   Caffeine use:  20 oz drinks per day   Right handed   Social Determinants of Health   Financial Resource Strain: Not on file  Food Insecurity: Not on file  Transportation Needs: Not on file  Physical Activity: Not on file  Stress: Not on file  Social Connections: Not on file  Intimate Partner Violence: Not on file      PHYSICAL EXAM  Vitals:   05/07/20 1456  BP: 139/81  Pulse: 82  Weight: 213 lb (96.6 kg)  Height: '5\' 10"'  (1.778 m)   Body mass index is 30.56 kg/m.  Generalized: Well developed, in no acute distress  Neurological examination  Mentation: Alert oriented to time, place, history taking. Follows all commands speech and language fluent Cranial nerve II-XII: Pupils were equal round reactive to light. Extraocular movements were full, visual field were full. Motor: The motor testing reveals 5 over 5 strength of all 4 extremities. Good symmetric motor tone is noted throughout. Mild action tremor bilateral hands  Sensory: Sensory testing is intact to soft touch on all 4 extremities. No evidence of extinction is noted. Pinprick testing decreased bilaterally of feet to ankle  Coordination: Cerebellar testing reveals good finger-nose-finger and heel-to-shin bilaterally.  Gait and station: Gait is stable. No assistive device. Mild left foot drop (followed by ortho) Reflexes: Deep tendon reflexes are symmetric and normal bilaterally.   DIAGNOSTIC DATA (LABS, IMAGING, TESTING) - I reviewed patient records, labs, notes, testing and imaging myself where available.  No flowsheet data found.   Lab Results   Component Value Date   WBC 6.5 12/13/2017   HGB 16.3 12/13/2017   HCT 49.7 12/13/2017   MCV 87.8 12/13/2017   PLT 162 12/13/2017      Component Value Date/Time   BUN 22 (A) 11/15/2019 0000   CREATININE 1.1 11/15/2019 0000   PROT 8.1 02/25/2015 1105   GFRNONAA 72 11/15/2019 0000   GFRAA 83 11/15/2019 0000   Lab Results  Component Value Date   CHOL 188 11/15/2019   HDL 31 (A) 11/15/2019   LDLCALC 91 11/15/2019   TRIG 400 (A) 11/15/2019   Lab Results  Component Value Date   HGBA1C 12.1 11/15/2019   No results found for: [id number] Lab Results  Component Value Date   TSH 0.29 (A) 11/15/2019     ASSESSMENT AND PLAN 60 y.o. year old Chavez  has a past medical history of Diabetes mellitus, Diabetes mellitus, type II ([place]), High cholesterol, Hypertension, Hypothyroidism, and Neuropathy in diabetes ([place]). here with     ICD-10-CM   1. Diabetic polyneuropathy associated with type 2 diabetes mellitus (HCC)  E11.42   2. Essential tremor  G25.0       Mr. Celmer is doing well overall.  Gabapentin 300 mg in the a.m. and 600 mg in the p.m. is helping to control neuropathy pain.  He has noted slight worsening of essential tremor. He would like to restart propranolol as it has helped in the past. We will start propranolol 25m BID. He will take 1/2 tablet twice daily for 1-2 weeks then increase dose if well tolerated. BP and pulse good. He will monitor closely at home.  We have discussed need for  good diabetic control.  He will continue close follow-up with primary care.  He will follow-up in 1 year, sooner if needed.  He verbalizes understanding and agreement with this plan.   No orders of the defined types were placed in this encounter.    Meds ordered this encounter  Medications  . propranolol (INDERAL) 20 MG tablet    Sig: Take 1 tablet (20 mg total) by mouth 2 (two) times daily.    Dispense:  180 tablet    Refill:  3    Order Specific Question:   Supervising Provider    Answer:    Melvenia Beam [[id number]]      XBO ERQSX, FNP-C 05/07/2020, 4:04 PM [facility] [address] [place], [place] [address] [phone]   Made any corrections needed, and agree with history, physical, neuro exam,assessment and plan as stated.     Sarina Ill, MD [facility]

## 2020-05-07 NOTE — Patient Instructions (Addendum)
Below is our plan:  We will continue gabapentin 300mg  in the morning and 600mg  in the evenings. I will start propranolol 20mg  twice daily. Start with 1/2 tablet (10mg ) twice daily for 2 weeks to see how you are going to tolerate it. If doing well, increase dose to 20mg  twice daily. Monitor blood pressure. Let me know if you have any concerns.   Please make sure you are staying well hydrated. I recommend 50-60 ounces daily. Well balanced diet and regular exercise encouraged. Consistent sleep schedule with 6-8 hours recommended.   Please continue follow up with care team as directed.   Follow up in 6-12 months   You may receive a survey regarding today's visit. I encourage you to leave honest feed back as I do use this information to improve patient care. Thank you for seeing me today!    Propranolol Tablets What is this medicine? PROPRANOLOL (proe PRAN oh lole) is a beta blocker. It decreases the amount of work your heart has to do and helps your heart beat regularly. It treats high blood pressure and/or prevent chest pain (also called angina). It is also used after a heart attack to prevent a second one. This medicine may be used for other purposes; ask your health care provider or pharmacist if you have questions. COMMON BRAND NAME(S): Inderal What should I tell my health care provider before I take this medicine? They need to know if you have any of these conditions:  circulation problems or blood vessel disease  diabetes  history of heart attack or heart disease, vasospastic angina  kidney disease  liver disease  lung or breathing disease, like asthma or emphysema  pheochromocytoma  slow heart rate  thyroid disease  an unusual or allergic reaction to propranolol, other beta-blockers, medicines, foods, dyes, or preservatives  pregnant or trying to get pregnant  breast-feeding How should I use this medicine? Take this drug by mouth. Take it as directed on the  prescription label at the same time every day. Keep taking it unless your health care provider tells you to stop. Talk to your health care provider about the use of this drug in children. Special care may be needed. Overdosage: If you think you have taken too much of this medicine contact a poison control center or emergency room at once. NOTE: This medicine is only for you. Do not share this medicine with others. What if I miss a dose? If you miss a dose, take it as soon as you can. If it is almost time for your next dose, take only that dose. Do not take double or extra doses. What may interact with this medicine? Do not take this medicine with any of the following medications:  feverfew  phenothiazines like chlorpromazine, mesoridazine, prochlorperazine, thioridazine This medicine may also interact with the following medications:  aluminum hydroxide gel  antipyrine  antiviral medicines for HIV or AIDS  barbiturates like phenobarbital  certain medicines for blood pressure, heart disease, irregular heart beat  cimetidine  ciprofloxacin  diazepam  fluconazole  haloperidol  isoniazid  medicines for cholesterol like cholestyramine or colestipol  medicines for mental depression  medicines for migraine headache like almotriptan, eletriptan, frovatriptan, naratriptan, rizatriptan, sumatriptan, zolmitriptan  NSAIDs, medicines for pain and inflammation, like ibuprofen or naproxen  phenytoin  rifampin  teniposide  theophylline  thyroid medicines  tolbutamide  warfarin  zileuton This list may not describe all possible interactions. Give your health care provider a list of all the medicines, herbs, non-prescription  drugs, or dietary supplements you use. Also tell them if you smoke, drink alcohol, or use illegal drugs. Some items may interact with your medicine. What should I watch for while using this medicine? Visit your doctor or health care professional for  regular check ups. Check your blood pressure and pulse rate regularly. Ask your health care professional what your blood pressure and pulse rate should be, and when you should contact them. You may get drowsy or dizzy. Do not drive, use machinery, or do anything that needs mental alertness until you know how this drug affects you. Do not stand or sit up quickly, especially if you are an older patient. This reduces the risk of dizzy or fainting spells. Alcohol can make you more drowsy and dizzy. Avoid alcoholic drinks. This medicine may increase blood sugar. Ask your healthcare provider if changes in diet or medicines are needed if you have diabetes. Do not treat yourself for coughs, colds, or pain while you are taking this medicine without asking your doctor or health care professional for advice. Some ingredients may increase your blood pressure. What side effects may I notice from receiving this medicine? Side effects that you should report to your doctor or health care professional as soon as possible:  allergic reactions like skin rash, itching or hives, swelling of the face, lips, or tongue  breathing problems  cold hands or feet  difficulty sleeping, nightmares  dry peeling skin  hallucinations  muscle cramps or weakness  signs and symptoms of high blood sugar such as being more thirsty or hungry or having to urinate more than normal. You may also feel very tired or have blurry vision.  slow heart rate  swelling of the legs and ankles  vomiting Side effects that usually do not require medical attention (report to your doctor or health care professional if they continue or are bothersome):  change in sex drive or performance  diarrhea  dry sore eyes  hair loss  nausea  weak or tired This list may not describe all possible side effects. Call your doctor for medical advice about side effects. You may report side effects to FDA at 1-800-FDA-1088. Where should I keep my  medicine? Keep out of the reach of children and pets. Store at room temperature between 20 and 25 degrees C (68 and 77 degrees F). Protect from light. Throw away any unused drug after the expiration date. NOTE: This sheet is a summary. It may not cover all possible information. If you have questions about this medicine, talk to your doctor, pharmacist, or health care provider.  2021 Elsevier/Gold Standard (2018-09-02 19:25:51)   Essential Tremor A tremor is trembling or shaking that a person cannot control. Most tremors affect the hands or arms. Tremors can also affect the head, vocal cords, legs, and other parts of the body. Essential tremor is a tremor without a known cause. Usually, it occurs while a person is trying to perform an action. It tends to get worse gradually as a person ages. What are the causes? The cause of this condition is not known. What increases the risk? You are more likely to develop this condition if:  You have a family member with essential tremor.  You are age 34 or older.  You take certain medicines. What are the signs or symptoms? The main sign of a tremor is a rhythmic shaking of certain parts of your body that is uncontrolled and unintentional. You may:  Have difficulty eating with a spoon or  fork.  Have difficulty writing.  Nod your head up and down or side to side.  Have a quivering voice. The shaking may:  Get worse over time.  Come and go.  Be more noticeable on one side of your body.  Get worse due to stress, fatigue, caffeine, and extreme heat or cold. How is this diagnosed? This condition may be diagnosed based on:  Your symptoms and medical history.  A physical exam. There is no single test to diagnose an essential tremor. However, your health care provider may order tests to rule out other causes of your condition. These may include:  Blood and urine tests.  Imaging studies of your brain, such as CT scan and MRI.  A test that  measures involuntary muscle movement (electromyogram).   How is this treated? Treatment for essential tremor depends on the severity of the condition.  Some tremors may go away without treatment.  Mild tremors may not need treatment if they do not affect your day-to-day life.  Severe tremors may need to be treated using one or more of the following options: ? Medicines. ? Lifestyle changes. ? Occupational or physical therapy. Follow these instructions at home: Lifestyle  Do not use any products that contain nicotine or tobacco, such as cigarettes and e-cigarettes. If you need help quitting, ask your health care provider.  Limit your caffeine intake as told by your health care provider.  Try to get 8 hours of sleep each night.  Find ways to manage your stress that fits your lifestyle and personality. Consider trying meditation or yoga.  Try to anticipate stressful situations and allow extra time to manage them.  If you are struggling emotionally with the effects of your tremor, consider working with a mental health provider.   General instructions  Take over-the-counter and prescription medicines only as told by your health care provider.  Avoid extreme heat and extreme cold.  Keep all follow-up visits as told by your health care provider. This is important. Visits may include physical therapy visits. Contact a health care provider if:  You experience any changes in the location or intensity of your tremors.  You start having a tremor after starting a new medicine.  You have tremor with other symptoms, such as: ? Numbness. ? Tingling. ? Pain. ? Weakness.  Your tremor gets worse.  Your tremor interferes with your daily life.  You feel down, blue, or sad for at least 2 weeks in a row.  Worrying about your tremor and what other people think about you interferes with your everyday life functions, including relationships, work, or school. Summary  Essential tremor is a  tremor without a known cause. Usually, it occurs when you are trying to perform an action.  You are more likely to develop this condition if you have a family member with essential tremor.  The main sign of a tremor is a rhythmic shaking of certain parts of your body that is uncontrolled and unintentional.  Treatment for essential tremor depends on the severity of the condition. This information is not intended to replace advice given to you by your health care provider. Make sure you discuss any questions you have with your health care provider. Document Revised: 10/20/2019 Document Reviewed: 10/20/2019 Elsevier Patient Education  2021 [employer]   Diabetic Neuropathy Diabetic neuropathy refers to nerve damage that is caused by diabetes. Over time, people with diabetes can develop nerve damage throughout the body. There are several types of diabetic neuropathy:  Peripheral  neuropathy. This is the most common type of diabetic neuropathy. It damages the nerves that carry signals between the spinal cord and other parts of the body (peripheral nerves). This usually affects nerves in the feet, legs, hands, and arms.  Autonomic neuropathy. This type causes damage to nerves that control involuntary functions (autonomic nerves). Involuntary functions are functions of the body that you do not control. They include heartbeat, body temperature, blood pressure, urination, digestion, sweating, sexual function, or response to changes in blood glucose.  Focal neuropathy. This type of nerve damage affects one area of the body, such as an arm, a leg, or the face. The injury may involve one nerve or a small group of nerves. Focal neuropathy can be painful and unpredictable. It occurs most often in older adults with diabetes. This often develops suddenly, but usually improves over time and does not cause long-term problems.  Proximal neuropathy. This type of nerve damage affects the nerves of the thighs,  hips, buttocks, or legs. It causes severe pain, weakness, and muscle death (atrophy), usually in the thigh muscles. It is more common among older men and people who have type 2 diabetes. The length of recovery time may vary. What are the causes? Peripheral, autonomic, and focal neuropathies are caused by diabetes that is not well controlled with treatment. The cause of proximal neuropathy is not known, but it may be caused by inflammation related to uncontrolled blood glucose levels. What are the signs or symptoms? Peripheral neuropathy Peripheral neuropathy develops slowly over time. When the nerves of the feet and legs no longer work, you may experience:  Burning, stabbing, or aching pain in the legs or feet.  Pain or cramping in the legs or feet.  Loss of feeling (numbness) and inability to feel pressure or pain in the feet. This can lead to: ? Thick calluses or sores on areas of constant pressure. ? Ulcers. ? Reduced ability to feel temperature changes.  Foot deformities.  Muscle weakness.  Loss of balance or coordination. Autonomic neuropathy The symptoms of autonomic neuropathy vary depending on which nerves are affected. Symptoms may include:  Problems with digestion, such as: ? Nausea or vomiting. ? Poor appetite. ? Bloating. ? Diarrhea or constipation. ? Trouble swallowing. ? Losing weight without trying to.  Problems with the heart, blood, and lungs, such as: ? Dizziness, especially when standing up. ? Fainting. ? Shortness of breath. ? Irregular heartbeat.  Bladder problems, such as: ? Trouble starting or stopping urination. ? Leaking urine. ? Trouble emptying the bladder. ? Urinary tract infections (UTIs).  Problems with other body functions, such as: ? Sweat. You may sweat too much or too little. ? Temperature. You might get hot easily. Or, you might feel cold more than usual. ? Sexual function. Men may not be able to get or maintain an erection. Women may  have vaginal dryness and difficulty with arousal. Focal neuropathy Symptoms affect only one area of the body. Common symptoms include:  Numbness.  Tingling.  Burning pain.  Prickling feeling.  Very sensitive skin.  Weakness.  Inability to move (paralysis).  Muscle twitching.  Muscles getting smaller (wasting).  Poor coordination.  Double or blurred vision. Proximal neuropathy  Sudden, severe pain in the hip, thigh, or buttocks. Pain may spread from the back into the legs (sciatica).  Pain and numbness in the arms and legs.  Tingling.  Loss of bladder control or bowel control.  Weakness and wasting of thigh muscles.  Difficulty getting up from a  seated position.  Abdominal swelling.  Unexplained weight loss. How is this diagnosed? Diagnosis varies depending on the type of neuropathy your health care provider suspects. Peripheral neuropathy Your health care provider will do a neurologic exam. This exam checks your reflexes, how you move, and what you can feel. You may have other tests, such as:  Blood tests.  Tests of the fluid that surrounds the spinal cord (lumbar puncture).  CT scan.  MRI.  Checking the nerves that control muscles (electromyogram, or EMG).  Checking how quickly signals pass through your nerves (nerve conduction study).  Checking a small piece of a nerve using a microscope (biopsy). Autonomic neuropathy You may have tests, such as:  Tests to measure your blood pressure and heart rate. You may be secured to an exam table that moves you from a lying position to an upright position (table tilt test).  Breathing tests to check your lungs.  Tests to check how food moves through the digestive system (gastric emptying tests).  Blood, sweat, or urine tests.  Ultrasound of your bladder.  Spinal fluid tests. Focal neuropathy This condition may be diagnosed with:  A neurologic exam.  CT scan.  MRI.  EMG.  Nerve conduction  study. Proximal neuropathy There is no test to diagnose this type of neuropathy. You may have tests to rule out other possible causes of this type of neuropathy. Tests may include:  X-rays of your spine and lumbar region.  Lumbar puncture.  MRI. How is this treated? The goal of treatment is to keep nerve damage from getting worse. Treatment may include:  Following your diabetes management plan. This will help keep your blood glucose level and your A1C level within your target range. This is the most important treatment.  Using prescription pain medicine. Follow these instructions at home: Diabetes management Follow your diabetes management plan as told by your health care provider.  Check your blood glucose levels.  Keep your blood glucose in your target range.  Have your A1C level checked at least two times a year, or as often as told.  Take over the counter and prescription medicines only as told by your health care provider. This includes insulin and diabetes medicine.   Lifestyle  Do not use any products that contain nicotine or tobacco, such as cigarettes, e-cigarettes, and chewing tobacco. If you need help quitting, ask your health care provider.  Be physically active every day. Include strength training and balance exercises.  Follow a healthy meal plan.  Work with your health care provider to manage your blood pressure.   General instructions  Ask your health care provider if the medicine prescribed to you requires you to avoid driving or using machinery.  Check your skin and feet every day for cuts, bruises, redness, blisters, or sores.  Keep all follow-up visits. This is important. Contact a health care provider if:  You have burning, stabbing, or aching pain in your legs or feet.  You are unable to feel pressure or pain in your feet.  You develop problems with digestion, such as: ? Nausea. ? Vomiting. ? Bloating. ? Constipation. ? Diarrhea. ? Abdominal  pain.  You have difficulty with urination, such as: ? Inability to control when you urinate (incontinence). ? Inability to completely empty the bladder (retention).  You feel as if your heart is racing (palpitations).  You feel dizzy, weak, or faint when you stand up. Get help right away if:  You cannot urinate.  You have sudden weakness or  loss of coordination.  You have trouble speaking.  You have pain or pressure in your chest.  You have an irregular heartbeat.  You have sudden inability to move a part of your body. These symptoms may represent a serious problem that is an emergency. Do not wait to see if the symptoms will go away. Get medical help right away. Call your local emergency services (911 in the [place] Do not drive yourself to the hospital. Summary  Diabetic neuropathy is nerve damage that is caused by diabetes. It can cause numbness and pain in the arms, legs, digestive tract, heart, and other body systems.  This condition is treated by keeping your blood glucose level and your A1C level within your target range. This can help prevent neuropathy from getting worse.  Check your skin and feet every day for cuts, bruises, redness, blisters, or sores.  Do not use any products that contain nicotine or tobacco, such as cigarettes, e-cigarettes, and chewing tobacco. This information is not intended to replace advice given to you by your health care provider. Make sure you discuss any questions you have with your health care provider. Document Revised: 06/08/2019 Document Reviewed: 06/08/2019 Elsevier Patient Education  2021 [employer].

## 2020-08-02 ENCOUNTER — Other Ambulatory Visit: Payer: Self-pay | Admitting: "Endocrinology

## 2020-12-05 ENCOUNTER — Other Ambulatory Visit: Payer: Self-pay | Admitting: "Endocrinology

## 2020-12-16 ENCOUNTER — Other Ambulatory Visit: Payer: Self-pay | Admitting: "Endocrinology

## 2021-01-15 ENCOUNTER — Other Ambulatory Visit: Payer: Self-pay | Admitting: "Endocrinology

## 2021-05-07 ENCOUNTER — Ambulatory Visit: Payer: BC Managed Care – PPO | Admitting: Family Medicine

## 2021-05-07 NOTE — Patient Instructions (Incomplete)

## 2021-05-07 NOTE — Progress Notes (Deleted)
? ? ?PATIENT: Joseph Chavez ?DOB: 12-Mar-1960 ? ?REASON FOR VISIT: follow up ?HISTORY FROM: patient ? ?No chief complaint on file. ?  ? ?HISTORY OF PRESENT ILLNESS: ? ?05/07/21 ALL:  ?Pax returns for follow up for diabetic neuropathy and tremor. He continues gabapentin 300/622m. We restarted propranolol 274mBID for worsening tremor.  ? ?05/07/2020 ALL:  ?Joseph Chavez for follow up for diabetic neuropathy and tremor. He continues gabapentin 300/600. Neuropathy pain is mild. Tremor is a little worse. Activity and stress make it worse. He was started on sertraline 254maily that helps. He is seeing a chiRestaurant manager, fast foodeck pain is better. A1C was 12.1 in 11/2019. He has been working on diet and exercise. He is taking Trulicity. He has appt upcoming with PCP.  ? ?02/01/2019 ALL:  ?Joseph Chavez a 60 6o. male here today for follow up for diabetic neuropathy and tremor. He is taking gabapentin 300m54m am and 600mg22mpm. He also takes alpha lipoic acid twice daily as well. He doesn't notice how much it helps until he misses a dose. Tremor is worse in the mornings when he is holding his coffee cup. He also has difficulty writing, specifically if he is anxious. He was previously taking propranolol for essential tremor but felt that it made him sleepy. He was working 70-80 hours a week at that time. He is on lisinopril 20mg 81my. BP is usually 110/70. A1C 11 at last visit.  ? ? ?HISTORY: (copied from my note on 07/26/2018) ? ?Joseph Chavez isTorrez58 y.o11male here today for follow up for diabetic neuropathy.  He reports that since his last visit with Dr. AhearnLavell Anchorss been fairly stable.  He did start out for left Perlick acid for neuropathy.  He feels that if he takes this consistently it does help.  He continues gabapentin 300 mg in the morning and 600 mg in the evenings.  This also helps with the tingling sensation.  He states that his dizziness is much improved.  He has had 1 small episode of dizziness since his last  visit.  This resolved spontaneously.  He states that he continues to have mild weakness of his left upper extremity.  He does feel that this is fairly stable and has not worsened.  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  He admits to about a 6 pound weight gain.  He does not routinely check blood sugars at home.  He thinks his last A1c was around 8. ?  ?  ?History (copied from Dr Ahern'Cathren Laineon 01/24/2018) ?  ?Interval history 01/24/2018: Patient is here for follow up. He has seen me before in 2017 for multiple issues including ataxia/poor balance, distal sensory loss in the setting of uncontrolled diabetes, left-sided subjective weakness, falls, he had stopped taking all his medications except neurontin at that time and hgba1c was > 11. . MRI Marland Kitchenf the Braina nd MRA of the head in 12/2017 were unremarkable. In the past I had recommended an MRi of the cervical spine and emg/ncs which patient did not complete. Labwork included Lyme, vitamin B1, multiple myeloma panel, vitamin B6, heavy metals, rheumatoid factor, hep C, RPR, ANA with reflex all of which were unremarkable.  Patient had uncontrolled diabetes last hemoglobin A1c when I initially saw him was greater than 11.  PMHx of uncontrolled DM, hypothyroidism, HLD, HTN. He denies No significant headaches, diplopia, aphasia, focal weakness, facial droop. He  endorsed getting choked on swallowing sometimes with saliva or liquids and tremors.  ?  ?Today he is here with his wife. He says it was a wake-up call for him. He had acute onset dizziness. He has lost weight. He is stopping soda. Last HgbA1c was 11.2 right before in November. His feet hurt. He has balance issues. He was spinning, worse when turned his head. He was vomiting. He went to the ED. His blood pressure was elevated as well. He was not taking medications for blood pressure. The dizziness improved with epley maneuver. Meclizine and zofran helped and he doesn't need that again.  He had another episode.   ?  ?He was seen in the emergency room December 13, 2017.  Reviewed notes.  He had acute onset dizziness and imbalance.  He also had associated emesis with his dizziness.  He also complained of some tingling sensation to his left hand as well as left face.  Exam did show some left-sided weakness with grip and dorsal plantar flexion.  Otherwise exam was normal with normal reflexes in the biceps and patellar.  He had some ataxia with left upper extremity finger-to-nose and with left lower extremity heel-to-shin and unsteady gait.  MRI was negative.  Diagnosed with peripheral vertigo.  Glucose was 293.   ?  ?[facility] which included patient being seen at the [facility] emergency room on December 13, 2017 for dizziness and vomiting.  At the time his blood pressure and glucose were quite elevated.  He was given meclizine and Zofran from the emergency room.  He was referred back to me his blood sugar is still uncontrolled. ?  ?HPI:  Joseph Chavez is a 61 y.o. male here as a referral from Dr. Nadara Mustard for multiple issues. PMHx of uncontrolled DM, hypothyroidism, HLD, His wife is here with him. He has been falling. He has poor balance. Memory has been funny. Symptoms started over the summer. 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 He just went down, he tripped over his feet. He has uncontrolled diabetes. He stopped taking his medication and his last HgbA1c is 11.2. Takes neurontin for neuropathy. 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 The pain is severe, tingling, burning, numbness. He has cramping in the feet. The symptoms are continuous and progressing and slowly worsening over the years. He is hypothyroid and his TSH is elevated. No significant headaches, diplopia, aphasia, focal weakness, facial droop. He has had uncontrolled glucose for 5 or more years. He says he has speaking problems, his  speech "catches". He has fatigue. He reports getting choked on swallowing sometimes with saliva or liquids. He has some occ.neck pain and LBP but no radicular symptoms.  Father with diabetic neuropathy. He reports tremors, he shakes with his hands a lot of the time per wife. Propranolol may hellp, he is unsure. His handwriting is poor. No vision changes. Denies smoking, denies current alcohol intake. In the past more social drinking, < 1 a day. He had part of his colon removed due to polyps. He is slow walker, has to look at his feet to see what is under his feet.  ?  ?Reviewed notes, labs and imaging from outside physicians, which showed: ?  ?HgbA1c 11.2 ?BMP with glucose 462 ?Creatinine 0.79 01/2015 ?LFTs wnl ?TSH 7.820 ?LDL 88 ?B12 346 ?  ?MRI of the brain: personally reviewed images: essentially normal  MRI of the brain, a few foci of t2 hyperintensity may be non-specific chronic microvascular changes ? ? ?REVIEW OF SYSTEMS: Out of a complete 14 system review of symptoms, the patient complains only of the following symptoms, tremor, numbness and all other reviewed systems are negative. ? ?ALLERGIES: ?No Known Allergies ? ?HOME MEDICATIONS: ?Outpatient Medications Prior to Visit  ?Medication Sig Dispense Refill  ? aspirin EC 81 MG tablet Take 81 mg by mouth daily. Swallow whole.    ? B-D UF III MINI PEN NEEDLES 31G X 5 MM MISC use at bedtime 100 each 5  ? Blood Glucose Monitoring Suppl (ACCU-CHEK AVIVA) device Use as instructed 1 each 0  ? Dulaglutide (TRULICITY [place]) Inject 1.5 mg into the skin once a week.     ? gabapentin (NEURONTIN) 300 MG capsule One in the morning and two at bedtime 270 capsule 3  ? gemfibrozil (LOPID) 600 MG tablet Take 1 tablet (600 mg total) by mouth 2 (two) times daily before a meal. 60 tablet 2  ? glucose blood (ACCU-CHEK AVIVA) test strip Use to test glucose 4 times a day 150 each 3  ? insulin degludec (TRESIBA FLEXTOUCH) 200 UNIT/ML FlexTouch Pen Inject 70 Units into the skin at  bedtime. 12 mL 2  ? Insulin Pen Needle (PEN NEEDLES) 31G X 6 MM MISC 1 each by Does not apply route at bedtime. 100 each 5  ? levothyroxine (SYNTHROID) 175 MCG tablet TAKE 1 TABLET BY MOUTH EVERY DAY BEFORE BREAK

## 2023-02-10 HISTORY — PX: COLONOSCOPY: SHX174

## 2023-04-21 ENCOUNTER — Other Ambulatory Visit: Payer: Self-pay | Admitting: Family Medicine

## 2023-04-21 DIAGNOSIS — M25572 Pain in left ankle and joints of left foot: Secondary | ICD-10-CM

## 2023-04-22 ENCOUNTER — Encounter: Payer: Self-pay | Admitting: Family Medicine

## 2023-04-26 ENCOUNTER — Ambulatory Visit
Admission: RE | Admit: 2023-04-26 | Discharge: 2023-04-26 | Disposition: A | Discharge status: Home / Self Care | Payer: Self-pay | Source: Ambulatory Visit | Attending: Family Medicine | Admitting: Family Medicine

## 2023-04-26 DIAGNOSIS — M25572 Pain in left ankle and joints of left foot: Secondary | ICD-10-CM

## 2023-05-06 ENCOUNTER — Ambulatory Visit: Payer: Self-pay | Admitting: Orthopedic Surgery

## 2023-05-06 ENCOUNTER — Other Ambulatory Visit (INDEPENDENT_AMBULATORY_CARE_PROVIDER_SITE_OTHER): Payer: Self-pay

## 2023-05-06 DIAGNOSIS — M25572 Pain in left ankle and joints of left foot: Secondary | ICD-10-CM | POA: Diagnosis not present

## 2023-05-06 DIAGNOSIS — S82392A Other fracture of lower end of left tibia, initial encounter for closed fracture: Secondary | ICD-10-CM

## 2023-05-06 NOTE — Progress Notes (Signed)
  Intake history:  There were no vitals taken for this visit. There is no height or weight on file to calculate BMI.    WHAT ARE WE SEEING YOU FOR TODAY?   left ankle(s)  How long has this bothered you? (DOI?DOS?WS?)  1 month(s) ago  Anticoag.  No  Diabetes Yes  Heart disease No  Hypertension Yes  SMOKING HX No  Kidney disease No  Any ALLERGIES ______________________________________________   Treatment:  Have you taken:  Tylenol Yes  Advil Yes  Had PT No  Had injection No  Other  _________________________

## 2023-05-06 NOTE — Progress Notes (Signed)
 Chief Complaint  Patient presents with   Ankle Injury    L after 3 falls in one day approx 1 mo ago.     History 63 year old male now retired history of neuropathy and diabetes and poor balance presents for evaluation of left ankle pain.  The patient fell on February 28.  He sought attention at urgent care x-rays were inconclusive.  The patient was placed in a cam walker boot presented to primary care complaining of pain needing further evaluation.  Primary care doctors saw him did a good evaluation sent him for CT scan but they were unable to obtain the results.  He is here today complaining of left ankle pain which is getting better and requesting definitive management regarding weightbearing etc.  Outside imaging x-rays left ankle 2 views poor quality imaging agree that CT was necessary  CT scan no report available  CT scan shows posterior malleolar fracture questionable medial malleolar fracture with intact ankle mortise and minimal arthritis in the weightbearing areas of the tibiotalar joint but some arthritis was seen.  Most of this was periarticular ossicles and calcifications  Past Medical History:  Diagnosis Date   Diabetes mellitus    Diabetes mellitus, type II (HCC)    High cholesterol    Hypertension    Hypothyroidism    Neuropathy in diabetes (HCC)    There were no vitals taken for this visit.  Left ankle Skin is normal with no blisters erythema redness.  Color capillary refill normal.  Light touch is felt but no filament testing was done Pulse feels normal No edema Ankle range of motion is approximately total of 15 degrees Medial malleolar line nontender posterior malleolus seems tender Overall foot position looks plantigrade and there is no deformity The patient is awake and alert he is oriented x 3 his mood and affect is normal.  I have already reviewed the 2 outside images I took a new x-ray today  DG Ankle Complete Left Result Date: 05/06/2023 Left ankle  status post fall 3 weeks ago x-rays secondary to poor quality initial films CT scan shows posterior malleolus fracture Periarticular ossicles of bone and degenerative changes.  The CT scan showed a posterior malleolar fracture which is barely visible on x-ray See CT scan for more definitive picture of the fracture Ankle mortise is intact Final diagnosis posterior malleolar fracture perhaps medial malleolar fracture see CT scan Ankle mortise intact     Encounter Diagnosis  Name Primary?   Pain in left ankle and joints of left foot Yes    Assessment and plan 63 year old male diabetic peripheral neuropathy poor balance posterior malleolar fracture approximately 77 weeks old minimal displacement  Recommend although we discussed a knee walker, because of his balance issues we decided to stay in the wheelchair and use the cam walker.  He can get up with a walker and get to the bathroom which is about 4 feet otherwise he is nonweightbearing  X-ray in 4 weeks  [id number]  cl tx post mall w/o manip

## 2023-05-11 ENCOUNTER — Encounter: Payer: Self-pay | Admitting: Neurology

## 2023-05-11 ENCOUNTER — Ambulatory Visit: Admitting: Neurology

## 2023-05-11 VITALS — BP 139/80 | HR 66 | Ht 69.5 in | Wt 222.0 lb

## 2023-05-11 DIAGNOSIS — E538 Deficiency of other specified B group vitamins: Secondary | ICD-10-CM

## 2023-05-11 DIAGNOSIS — Z7984 Long term (current) use of oral hypoglycemic drugs: Secondary | ICD-10-CM | POA: Diagnosis not present

## 2023-05-11 DIAGNOSIS — R278 Other lack of coordination: Secondary | ICD-10-CM | POA: Diagnosis not present

## 2023-05-11 DIAGNOSIS — E1142 Type 2 diabetes mellitus with diabetic polyneuropathy: Secondary | ICD-10-CM
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
## 2023-05-11 NOTE — Patient Instructions (Addendum)
 Check B12 today, deficiency can cause worsening nerve damage Control of Diabetes When ready, can MRi lumbar spine and send to PT Recommend Walking aids Fall risk prevention  In patients with diabetes, sensory ataxia, a type of ataxia resulting from impaired sensory feedback, can occur due to diabetic neuropathy, leading to postural instability and coordination problems that worsen when visual input is absent.  Here's a more detailed explanation: What is Sensory Ataxia? Sensory ataxia is a form of ataxia caused by damage to the somatosensory nerves, which transmit sensory information from the body to the brain, leading to a disruption in the body's ability to receive and process sensory feedback.  Diabetic Neuropathy and Sensory Ataxia: In individuals with diabetes, diabetic neuropathy, a common complication of the disease, can damage these sensory nerves, leading to sensory ataxia.  Symptoms: Postural Instability: Loss of balance and difficulty maintaining an upright posture, especially when visual cues are limited or absent.  Coordination Problems: Difficulty with fine motor tasks, such as writing or buttoning clothes, and clumsiness.  Waddling Gait: A wide-based, unsteady gait, often described as "waddling like a duck".  Numbness and Tingling: Patients may experience numbness, tingling, or burning sensations in the extremities.  Causes of Sensory Ataxia in Diabetes: Diabetic Peripheral Neuropathy: Damage to the peripheral nerves, which transmit sensory information from the body to the brain.   Fall Prevention in the Home, Adult Falls can cause injuries and affect people of all ages. There are many simple things that you can do to make your home safe and to help prevent falls. If you need it, ask for help making these changes. What actions can I take to prevent falls? General information Use good lighting in all rooms. Make sure to: Replace any light bulbs that burn out. Turn on lights if  it is dark and use night-lights. Keep items that you use often in easy-to-reach places. Lower the shelves around your home if needed. Move furniture so that there are clear paths around it. Do not keep throw rugs or other things on the floor that can make you trip. If any of your floors are uneven, fix them. Add color or contrast paint or tape to clearly mark and help you see: Grab bars or handrails. First and last steps of staircases. Where the edge of each step is. If you use a ladder or stepladder: Make sure that it is fully opened. Do not climb a closed ladder. Make sure the sides of the ladder are locked in place. Have someone hold the ladder while you use it. Know where your pets are as you move through your home. What can I do in the bathroom?     Keep the floor dry. Clean up any water that is on the floor right away. Remove soap buildup in the bathtub or shower. Buildup makes bathtubs and showers slippery. Use non-skid mats or decals on the floor of the bathtub or shower. Attach bath mats securely with double-sided, non-slip rug tape. If you need to sit down while you are in the shower, use a non-slip stool. Install grab bars by the toilet and in the bathtub and shower. Do not use towel bars as grab bars. What can I do in the bedroom? Make sure that you have a light by your bed that is easy to reach. Do not use any sheets or blankets on your bed that hang to the floor. Have a firm bench or chair with side arms that you can use for support when you  get dressed. What can I do in the kitchen? Clean up any spills right away. If you need to reach something above you, use a sturdy step stool that has a grab bar. Keep electrical cables out of the way. Do not use floor polish or wax that makes floors slippery. What can I do with my stairs? Do not leave anything on the stairs. Make sure that you have a light switch at the top and the bottom of the stairs. Have them installed if you  do not have them. Make sure that there are handrails on both sides of the stairs. Fix handrails that are broken or loose. Make sure that handrails are as long as the staircases. Install non-slip stair treads on all stairs in your home if they do not have carpet. Avoid having throw rugs at the top or bottom of stairs, or secure the rugs with carpet tape to prevent them from moving. Choose a carpet design that does not hide the edge of steps on the stairs. Make sure that carpet is firmly attached to the stairs. Fix any carpet that is loose or worn. What can I do on the outside of my home? Use bright outdoor lighting. Repair the edges of walkways and driveways and fix any cracks. Clear paths of anything that can make you trip, such as tools or rocks. Add color or contrast paint or tape to clearly mark and help you see high doorway thresholds. Trim any bushes or trees on the main path into your home. Check that handrails are securely fastened and in good repair. Both sides of all steps should have handrails. Install guardrails along the edges of any raised decks or porches. Have leaves, snow, and ice cleared regularly. Use sand, salt, or ice melt on walkways during winter months if you live where there is ice and snow. In the garage, clean up any spills right away, including grease or oil spills. What other actions can I take? Review your medicines with your health care provider. Some medicines can make you confused or feel dizzy. This can increase your chance of falling. Wear closed-toe shoes that fit well and support your feet. Wear shoes that have rubber soles and low heels. Use a cane, walker, scooter, or crutches that help you move around if needed. Talk with your provider about other ways that you can decrease your risk of falls. This may include seeing a physical therapist to learn to do exercises to improve movement and strength. Where to find more information Centers for Disease Control and  Prevention, [employer]: TonerPromos.no [employer] on Aging: BaseRingTones.pl National Institute on Aging: BaseRingTones.pl Contact a health care provider if: You are afraid of falling at home. You feel weak, drowsy, or dizzy at home. You fall at home. Get help right away if you: Lose consciousness or have trouble moving after a fall. Have a fall that causes a head injury. These symptoms may be an emergency. Get help right away. Call 911. Do not wait to see if the symptoms will go away. Do not drive yourself to the hospital. This information is not intended to replace advice given to you by your health care provider. Make sure you discuss any questions you have with your health care provider. Document Revised: 09/29/2021 Document Reviewed: 09/29/2021 Elsevier Patient Education  2024 [employer].

## 2023-05-12 ENCOUNTER — Encounter: Payer: Self-pay | Admitting: Neurology

## 2023-05-14 LAB — METHYLMALONIC ACID, SERUM: Methylmalonic Acid: 153 nmol/L (ref 0–378)

## 2023-05-14 LAB — B12 AND FOLATE PANEL
Folate: 15 ng/mL (ref >3.0)
Vitamin B-12: 396 pg/mL (ref 232–1245)

## 2023-05-31 DIAGNOSIS — S82392A Other fracture of lower end of left tibia, initial encounter for closed fracture: Secondary | ICD-10-CM | POA: Insufficient documentation

## 2023-06-03 ENCOUNTER — Other Ambulatory Visit (INDEPENDENT_AMBULATORY_CARE_PROVIDER_SITE_OTHER): Payer: Self-pay

## 2023-06-03 ENCOUNTER — Ambulatory Visit (INDEPENDENT_AMBULATORY_CARE_PROVIDER_SITE_OTHER): Admitting: Orthopedic Surgery

## 2023-06-03 DIAGNOSIS — S82392D Other fracture of lower end of left tibia, subsequent encounter for closed fracture with routine healing: Secondary | ICD-10-CM | POA: Diagnosis not present

## 2023-06-03 NOTE — Progress Notes (Signed)
   There were no vitals taken for this visit.  There is no height or weight on file to calculate BMI.  Chief Complaint  Patient presents with   Ankle Injury    Left     Encounter Diagnosis  Name Primary?   Closed fracture of posterior malleolus of left tibia with routine healing, subsequent encounter 04/09/23 Yes    DOI/DOS/ Date: 04/09/23  Improved  No issues right now for Joseph Chavez has been compliant with skin care and weightbearing restrictions with a walker in a boot his x-ray shows the fracture is healing the ankle is intact no signs of subluxation repeat x-ray in 4 weeks

## 2023-06-03 NOTE — Progress Notes (Signed)
   There were no vitals taken for this visit.  There is no height or weight on file to calculate BMI.  Chief Complaint  Patient presents with   Ankle Injury    Left     Encounter Diagnosis  Name Primary?   Closed fracture of posterior malleolus of left tibia with routine healing, subsequent encounter 04/09/23 Yes    DOI/DOS/ Date: 04/09/23  Improved

## 2023-06-04 ENCOUNTER — Institutional Professional Consult (permissible substitution): Admitting: Neurology

## 2023-06-18 ENCOUNTER — Institutional Professional Consult (permissible substitution): Admitting: Neurology

## 2023-07-01 ENCOUNTER — Encounter: Payer: Self-pay | Admitting: Orthopedic Surgery

## 2023-07-01 ENCOUNTER — Ambulatory Visit (INDEPENDENT_AMBULATORY_CARE_PROVIDER_SITE_OTHER): Payer: Self-pay

## 2023-07-01 ENCOUNTER — Ambulatory Visit (INDEPENDENT_AMBULATORY_CARE_PROVIDER_SITE_OTHER): Admitting: Orthopedic Surgery

## 2023-07-01 DIAGNOSIS — S82392D Other fracture of lower end of left tibia, subsequent encounter for closed fracture with routine healing: Secondary | ICD-10-CM | POA: Diagnosis not present

## 2023-07-01 NOTE — Progress Notes (Signed)
   There were no vitals taken for this visit.  There is no height or weight on file to calculate BMI.  Chief Complaint  Patient presents with   Ankle Injury    Encounter Diagnosis  Name Primary?   Closed fracture of posterior malleolus of left tibia with routine healing, subsequent encounter 04/09/23 Yes    DOI/DOS/ Date: 04/09/23  Improved

## 2023-07-01 NOTE — Progress Notes (Signed)
   Chief Complaint  Patient presents with   Ankle Injury    Encounter Diagnosis  Name Primary?   Closed fracture of posterior malleolus of left tibia with routine healing, subsequent encounter 04/09/23 Yes    DOI/DOS/ Date: 04/09/23  Improved  Mr Skoda has no pain and no issues at present ; anxious to drive   Today's imaging studies show healing of the fracture ankle mortise intact  I checked his skin and it was normal  He was ambulating in the boot no pain  Remove boot resume normal activities  Okay to drive  Return as needed

## 2024-02-25 ENCOUNTER — Other Ambulatory Visit (HOSPITAL_COMMUNITY): Payer: Self-pay | Admitting: Family Medicine

## 2024-02-25 DIAGNOSIS — R269 Unspecified abnormalities of gait and mobility: Secondary | ICD-10-CM

## 2024-02-25 DIAGNOSIS — S32020A Wedge compression fracture of second lumbar vertebra, initial encounter for closed fracture: Secondary | ICD-10-CM

## 2024-02-25 DIAGNOSIS — R251 Tremor, unspecified: Secondary | ICD-10-CM

## 2024-03-03 ENCOUNTER — Ambulatory Visit (HOSPITAL_COMMUNITY)
Admission: RE | Admit: 2024-03-03 | Discharge: 2024-03-03 | Disposition: A | Source: Ambulatory Visit | Attending: Family Medicine | Admitting: Family Medicine

## 2024-03-03 DIAGNOSIS — R251 Tremor, unspecified: Secondary | ICD-10-CM | POA: Diagnosis present

## 2024-03-03 DIAGNOSIS — R269 Unspecified abnormalities of gait and mobility: Secondary | ICD-10-CM | POA: Diagnosis present

## 2024-03-03 DIAGNOSIS — R413 Other amnesia: Secondary | ICD-10-CM | POA: Diagnosis not present

## 2024-03-03 DIAGNOSIS — S32020A Wedge compression fracture of second lumbar vertebra, initial encounter for closed fracture: Secondary | ICD-10-CM | POA: Insufficient documentation
